# Patient Record
Sex: Female | Born: 1935 | Race: Black or African American | Hispanic: No | Smoking: Never smoker
Health system: Southern US, Community
[De-identification: ages and names within clinical notes are randomized; demographics above are authoritative.]

## PROBLEM LIST (undated history)

## (undated) DIAGNOSIS — I10 Essential (primary) hypertension: Secondary | ICD-10-CM

## (undated) DIAGNOSIS — I482 Chronic atrial fibrillation, unspecified: Secondary | ICD-10-CM

## (undated) DIAGNOSIS — I2510 Atherosclerotic heart disease of native coronary artery without angina pectoris: Secondary | ICD-10-CM

## (undated) HISTORY — PX: CORONARY ANGIOPLASTY WITH STENT PLACEMENT: SHX49

## (undated) HISTORY — DX: Atherosclerotic heart disease of native coronary artery without angina pectoris: I25.10

## (undated) HISTORY — PX: NASAL SINUS SURGERY: SHX719

## (undated) HISTORY — DX: Essential (primary) hypertension: I10

## (undated) HISTORY — PX: CHOLECYSTECTOMY: SHX55

## (undated) HISTORY — PX: ABDOMINAL HYSTERECTOMY: SHX81

## (undated) HISTORY — DX: Chronic atrial fibrillation, unspecified: I48.20

---

## 2005-10-17 ENCOUNTER — Emergency Department: Payer: Self-pay | Admitting: Emergency Medicine

## 2006-08-14 ENCOUNTER — Emergency Department: Payer: Self-pay | Admitting: Emergency Medicine

## 2006-08-14 ENCOUNTER — Other Ambulatory Visit: Payer: Self-pay

## 2007-02-27 ENCOUNTER — Emergency Department: Payer: Self-pay | Admitting: Emergency Medicine

## 2007-10-25 IMAGING — CR DG CHEST 2V
1 series · 2 of 2 positions shown · non-contrast
Comparison: none

REASON FOR EXAM: chest pain
COMMENTS:

PROCEDURE:     DXR - DXR CHEST PA (OR AP) AND LATERAL  - August 14, 2006 [DATE]
RESULT:     The lung fields are clear. The heart, mediastinal and osseous
structures show no significant abnormalities.

[Series 1: view not recorded · 0.17mm/px · 2 of 2 slices shown]
[im 1/2]
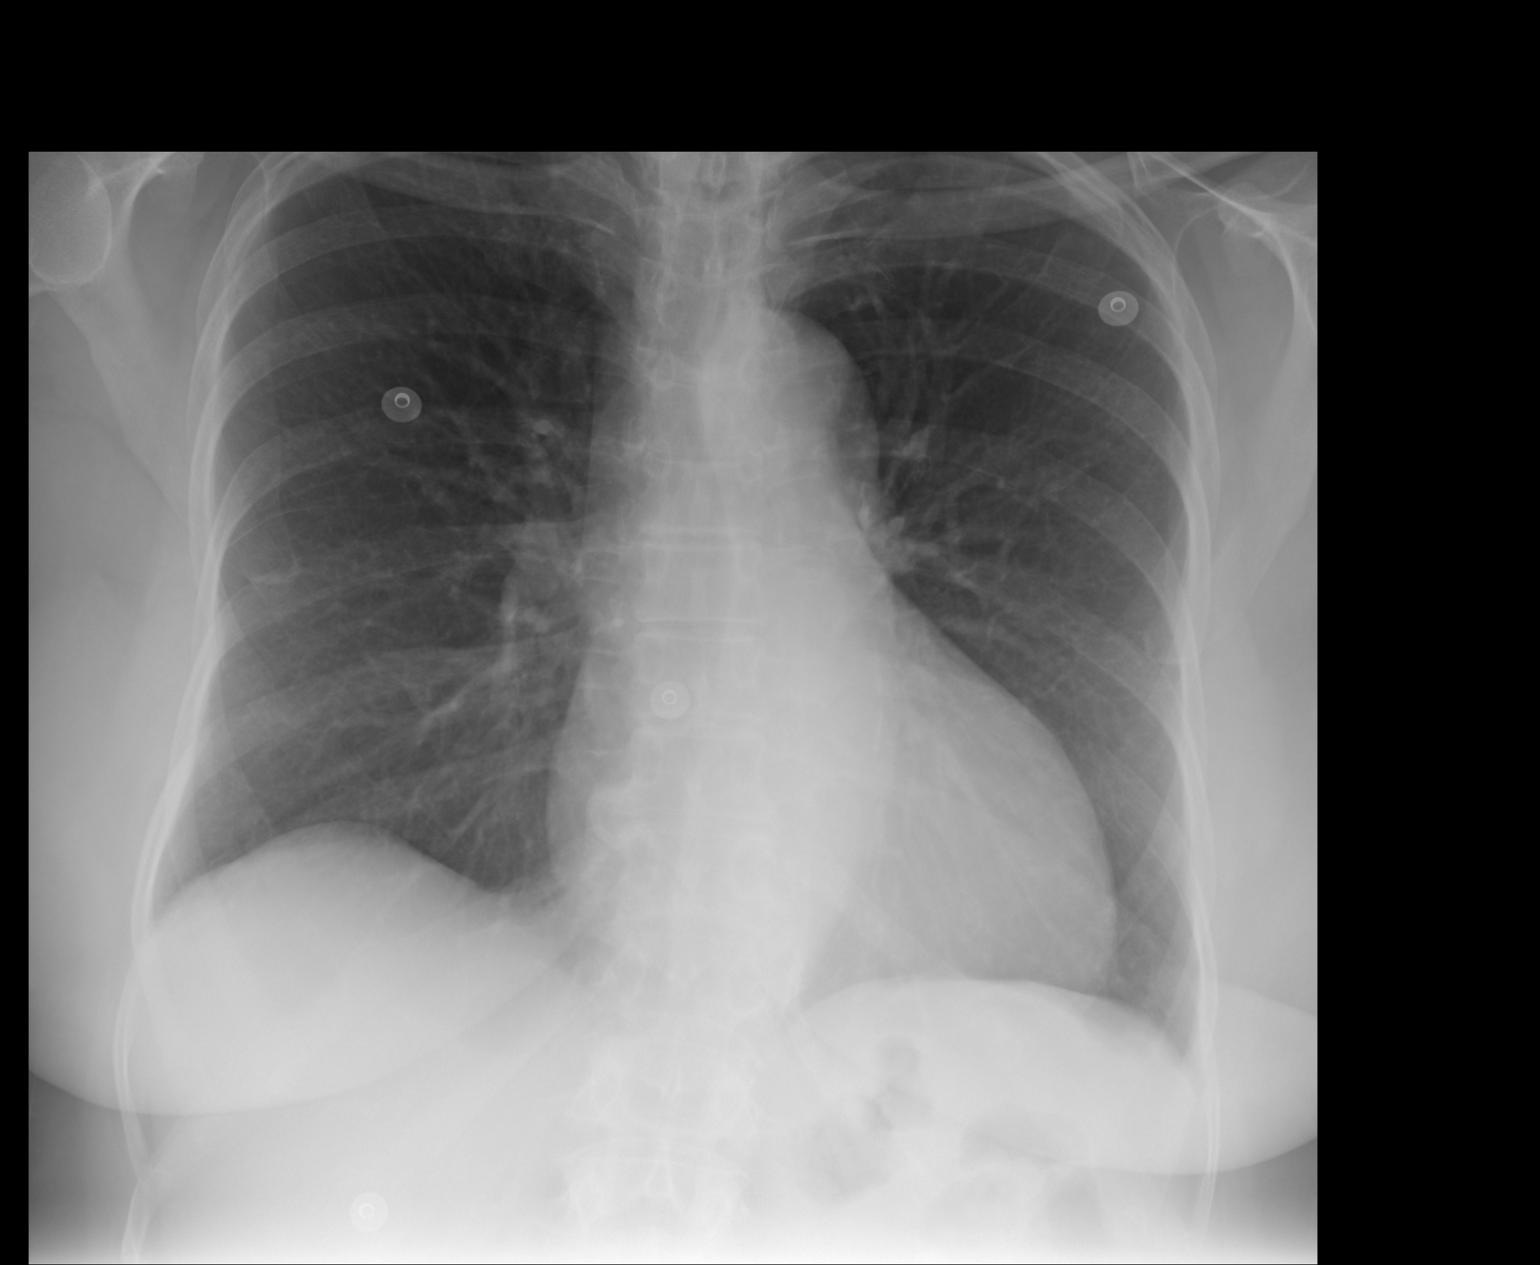
[im 2/2]
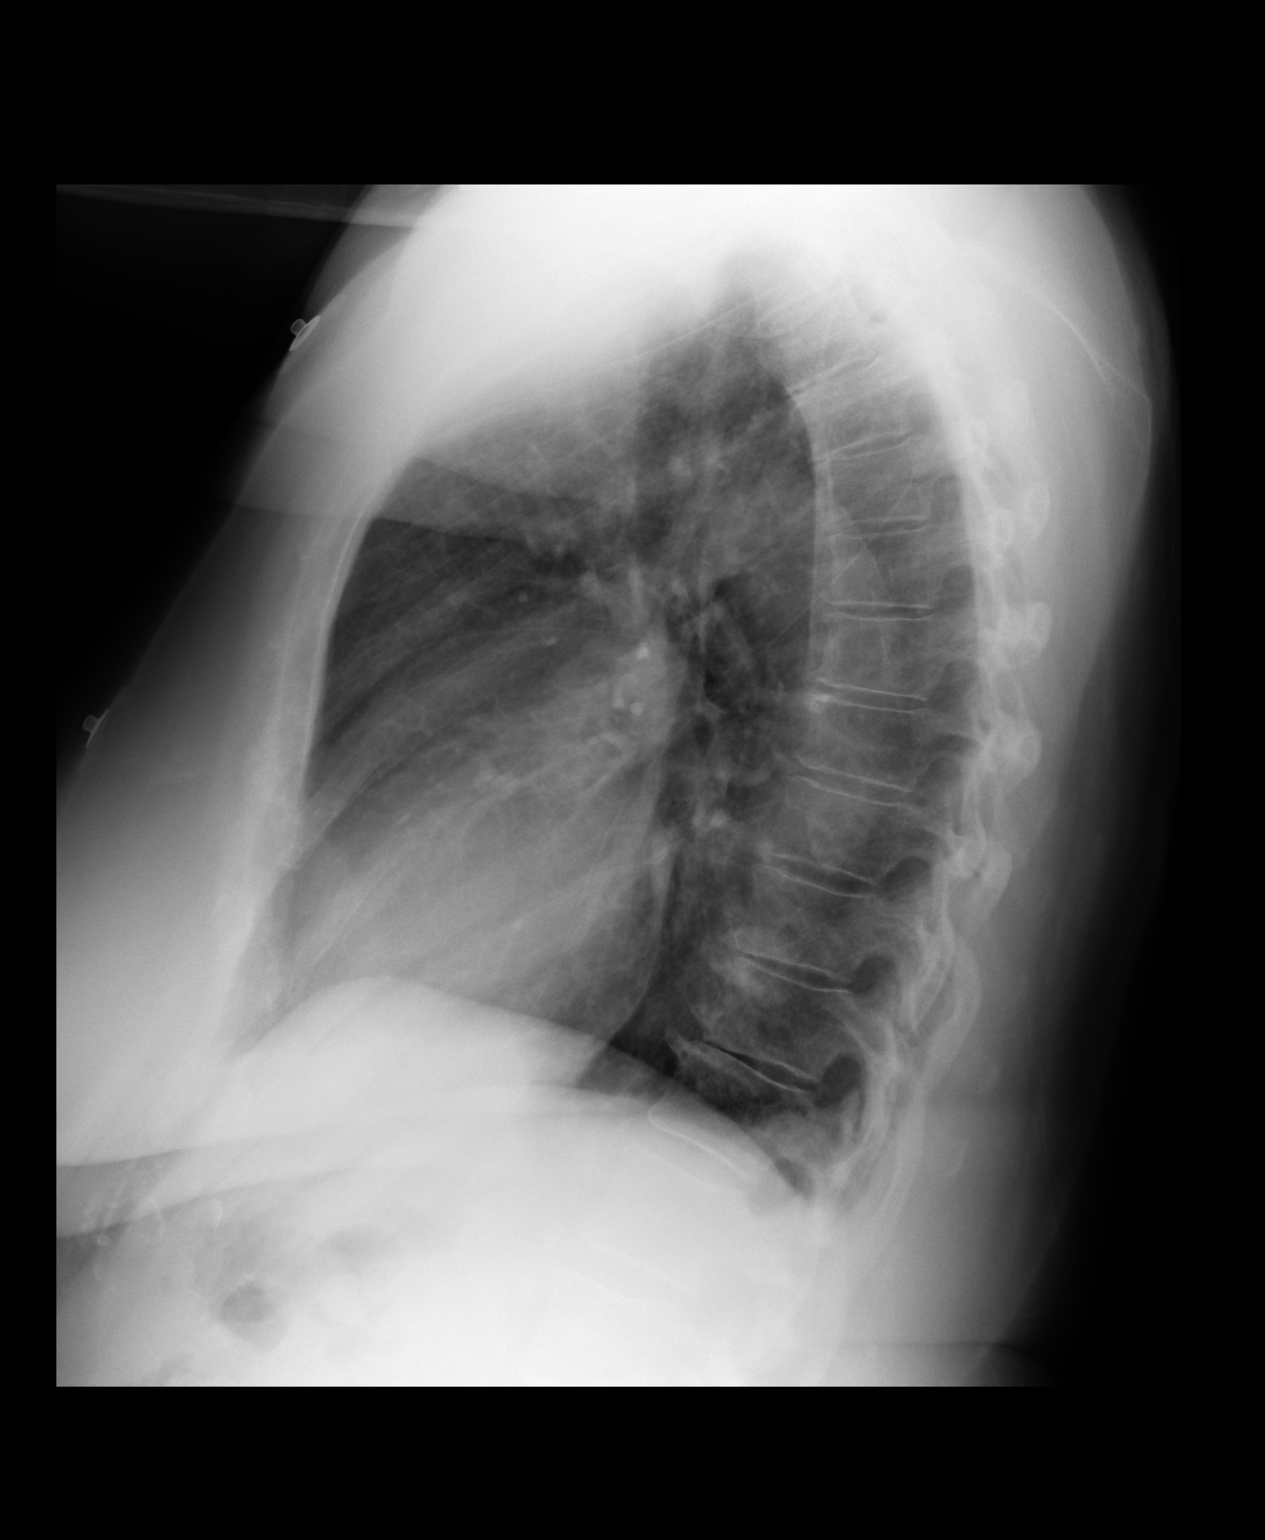

[2 of 2 positions shown; findings below may reference images not displayed]

IMPRESSION: 1)No acute changes are identified.

2)Although not mentioned above there is mild degenerative spurring at
multiple levels of the lower thoracic spine.
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
## 2008-05-09 IMAGING — CR PELVIS - 1-2 VIEW
1 series · 1 of 1 positions shown · non-contrast
Comparison: none

REASON FOR EXAM: pain/ hurts to walk no trauma
COMMENTS:

PROCEDURE:     DXR - DXR PELVIS AP ONLY  - February 27, 2007  [DATE]
RESULT:     An AP view of the bony pelvis shows no fracture or other acute
bony abnormality. The hips are visualized bilaterally and show no acute
changes. There are noted degenerative changes of the lower lumbar spine.

[view not recorded]
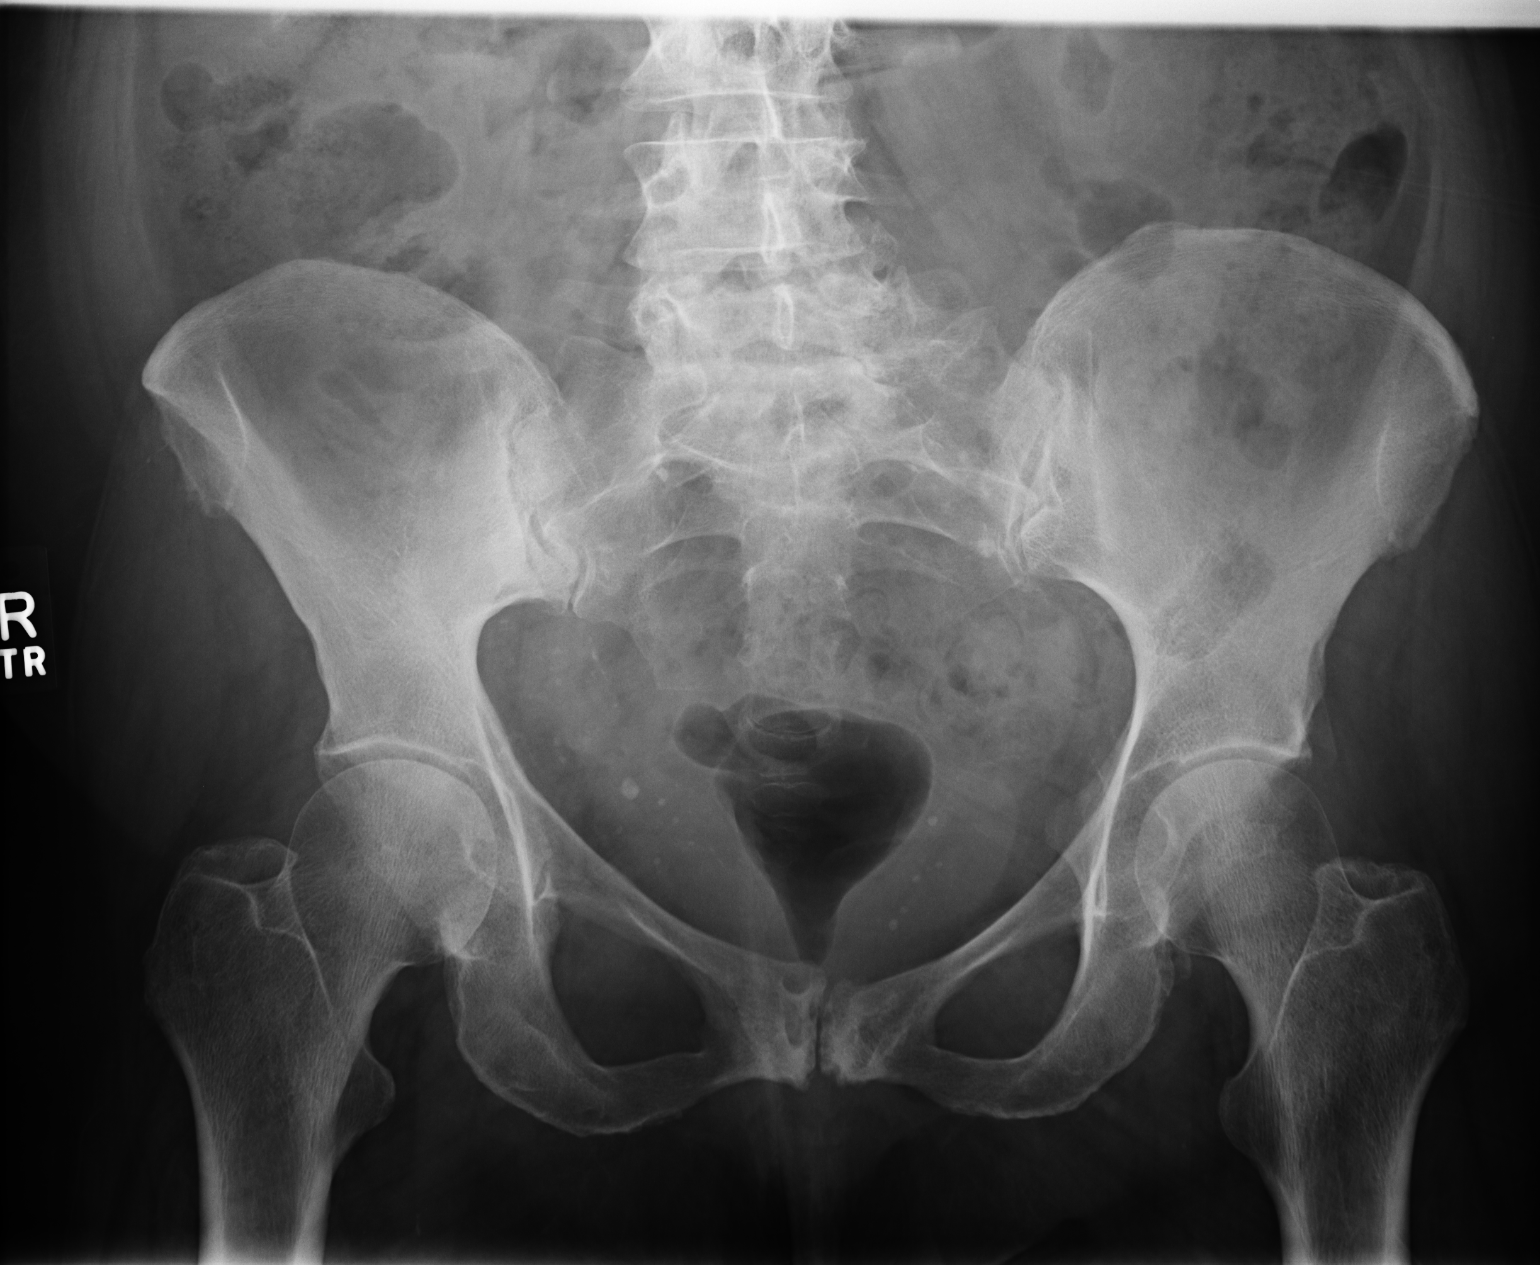

[1 of 1 positions shown; findings below may reference images not displayed]

IMPRESSION: 1.     No acute bony abnormalities of the pelvis are seen.
2.     There are degenerative changes of the lower lumbar spine.

## 2008-05-09 IMAGING — CR DG FEMUR 2V*L*
1 series · 5 of 5 positions shown · non-contrast
Comparison: none

REASON FOR EXAM: pain hurts to walk
COMMENTS:

[Series 1: view not recorded · 0.17mm/px · 5 of 5 slices shown]
[im 1/5]
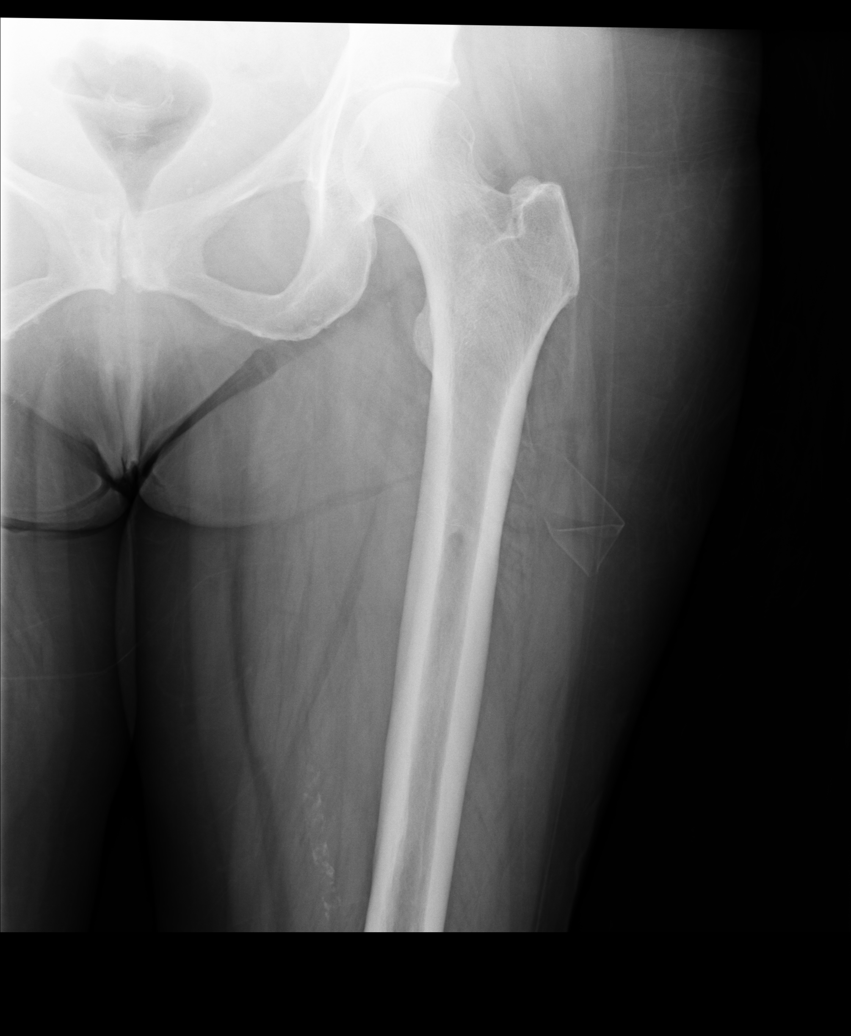
[im 2/5]
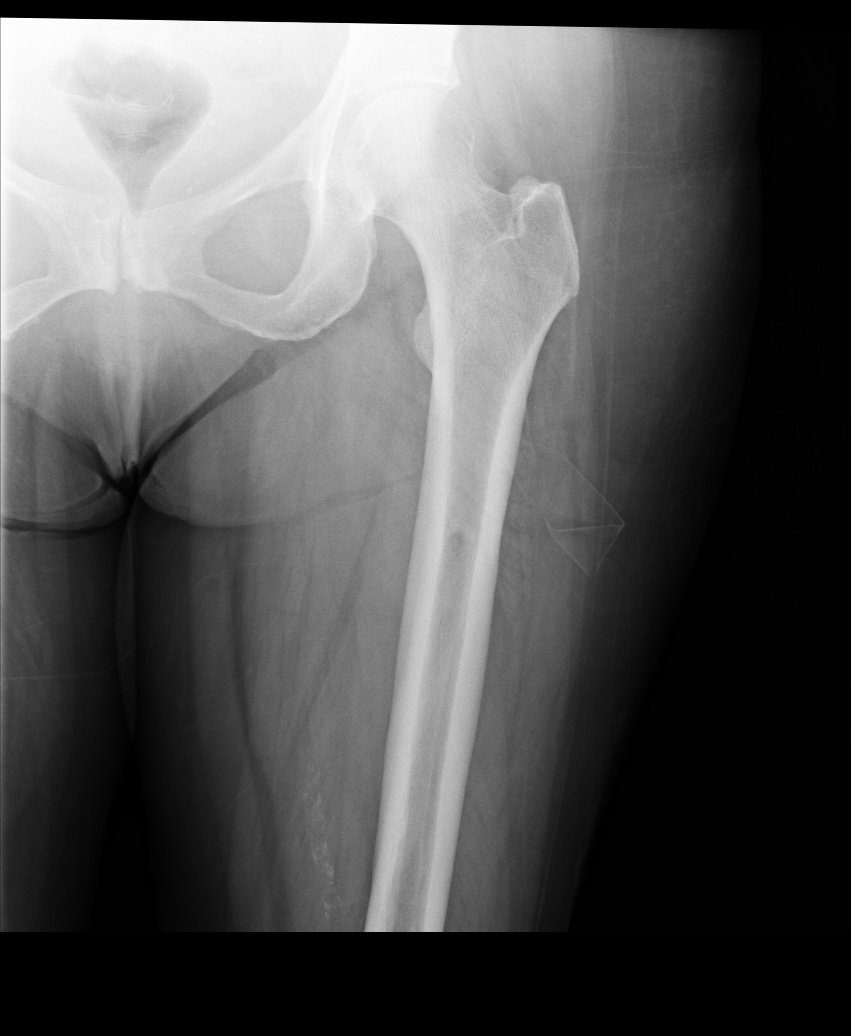
[im 3/5]
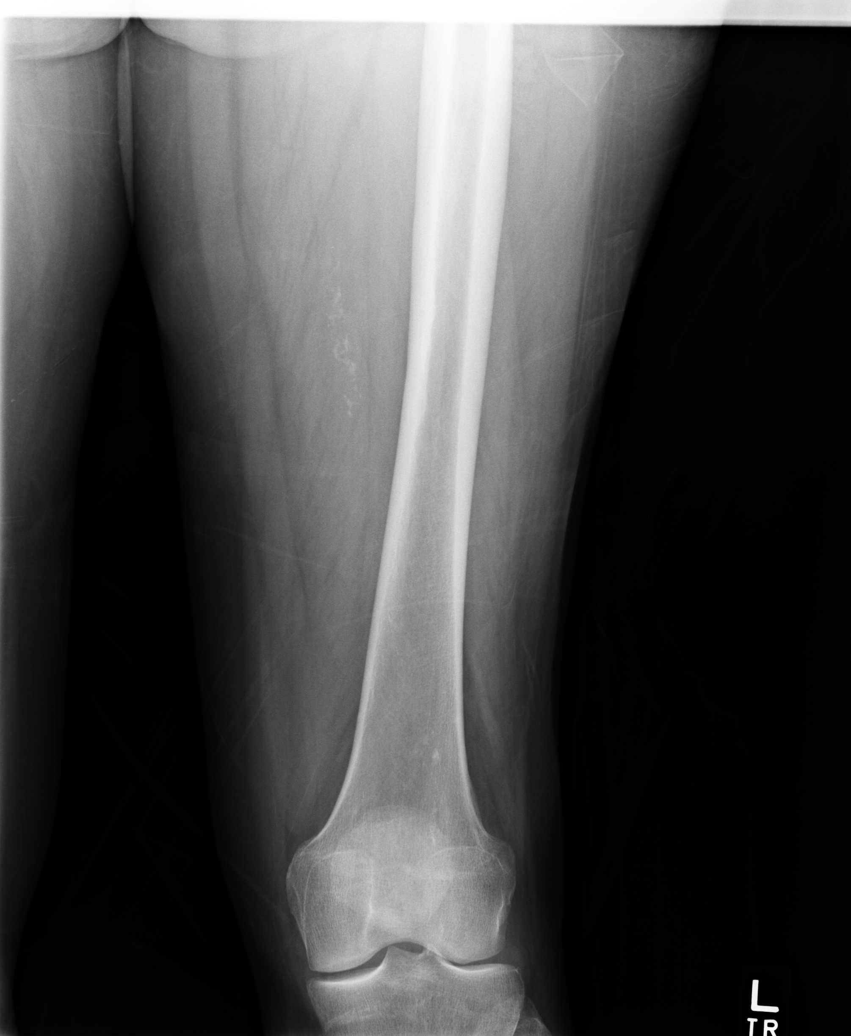
[im 4/5]
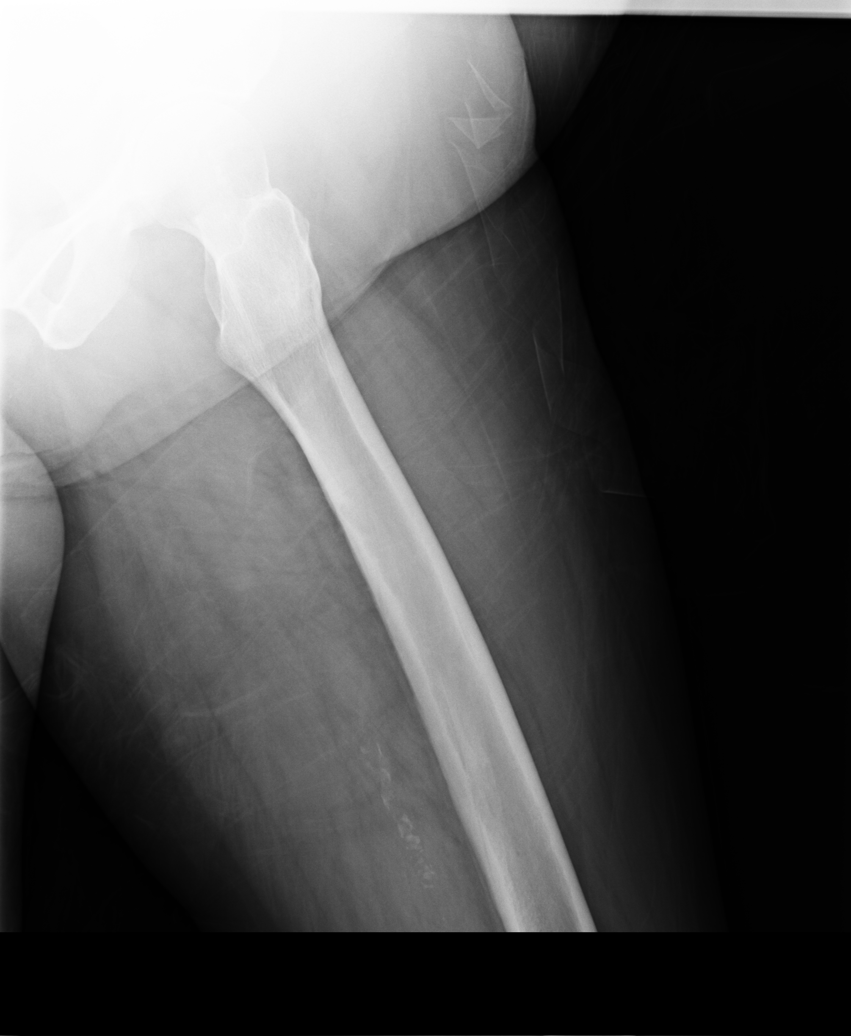
[im 5/5]
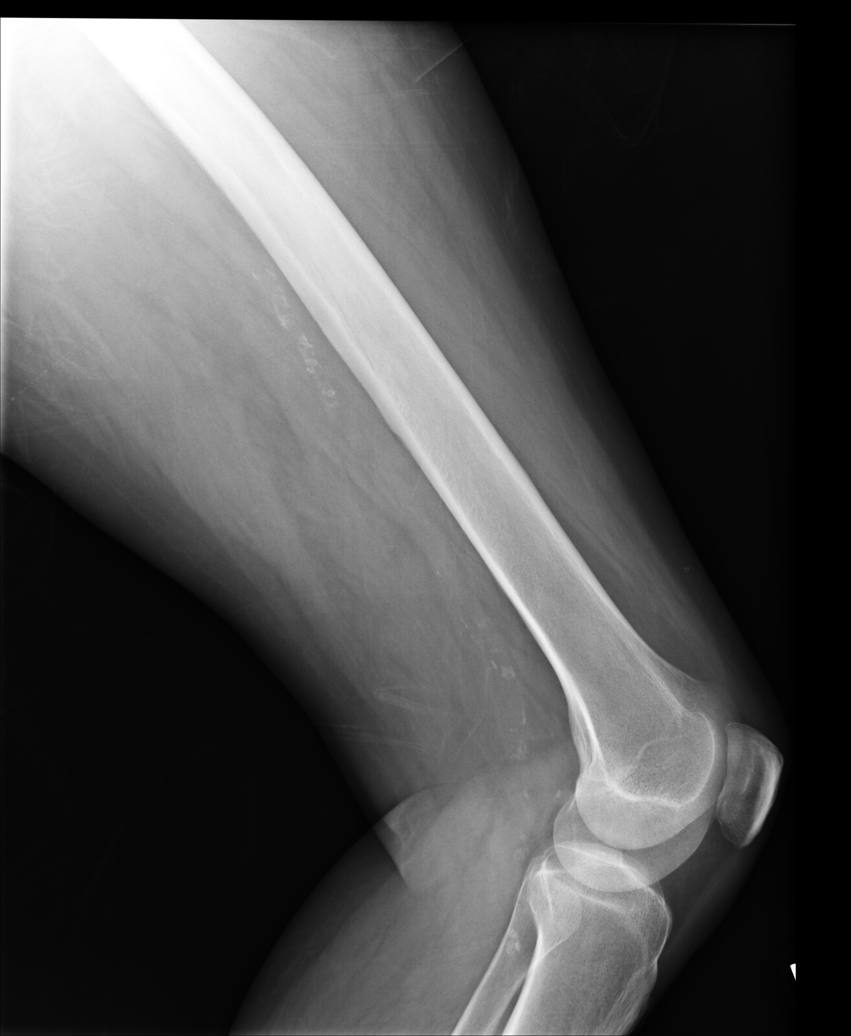

[5 of 5 positions shown; findings below may reference images not displayed]

PROCEDURE:     DXR - DXR FEMUR LEFT  - February 27, 2007  [DATE]

RESULT:     AP and lateral views show a 9 mm radiolucent focus in the
proximal LEFT tibial diaphysis. This is seen only in AP view and may be a
spurious finding. Followup examination is recommended. If this finding
persists, bone scan would be recommended for additional evaluation.
IMPRESSION: 1. No fracture or dislocation is seen.
2. There is a focal 9-mm lucent area in the proximal femoral diaphysis. As
noted above, this is seen in only one view. Followup examination and
possible bone scan are recommended.

## 2008-05-09 IMAGING — CR DG TIBIA/FIBULA 2V*L*
1 series · 2 of 2 positions shown · non-contrast
Comparison: none

REASON FOR EXAM: pain/hurts to walk no hx trauma
COMMENTS:

PROCEDURE:     DXR - DXR TIBIA AND FIBULA LT (LOWER L  - February 27, 2007  [DATE]
RESULT:     No fracture, dislocation or other acute bony abnormality is
identified.

[Series 1: view not recorded · 0.17mm/px · 2 of 2 slices shown]
[im 1/2]
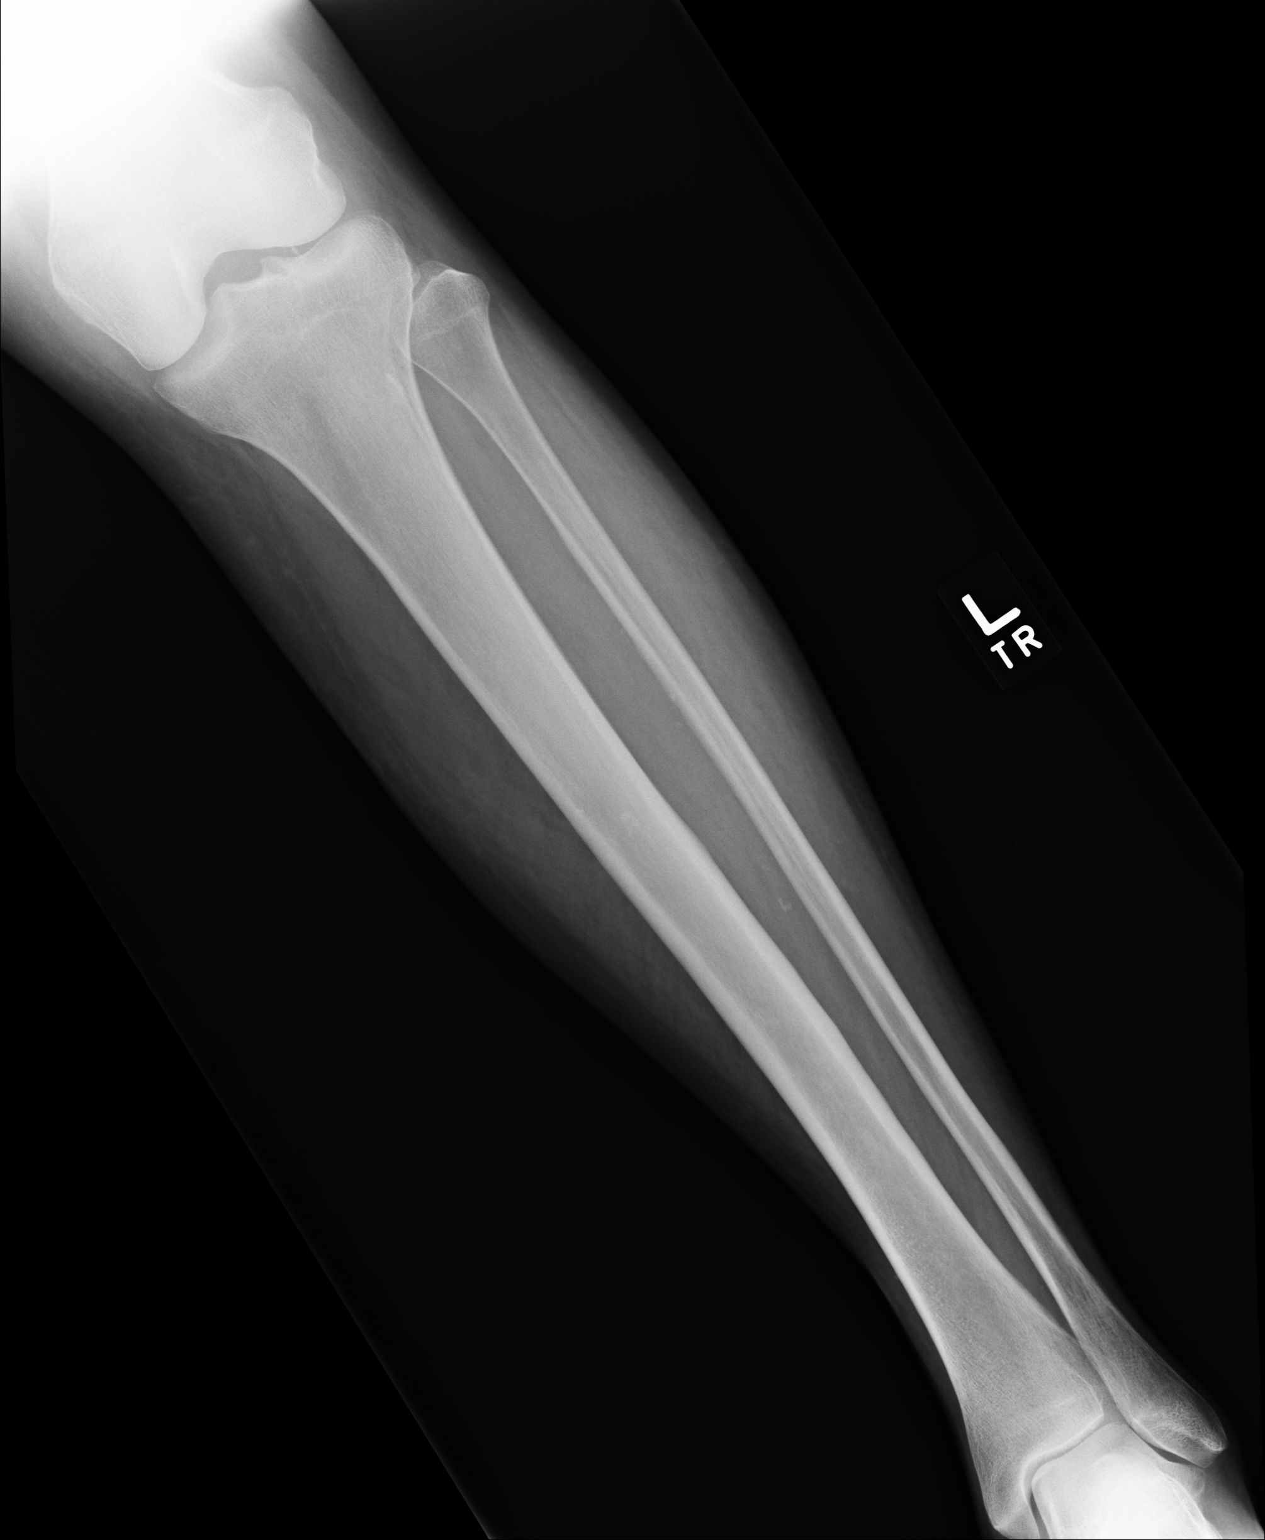
[im 2/2]
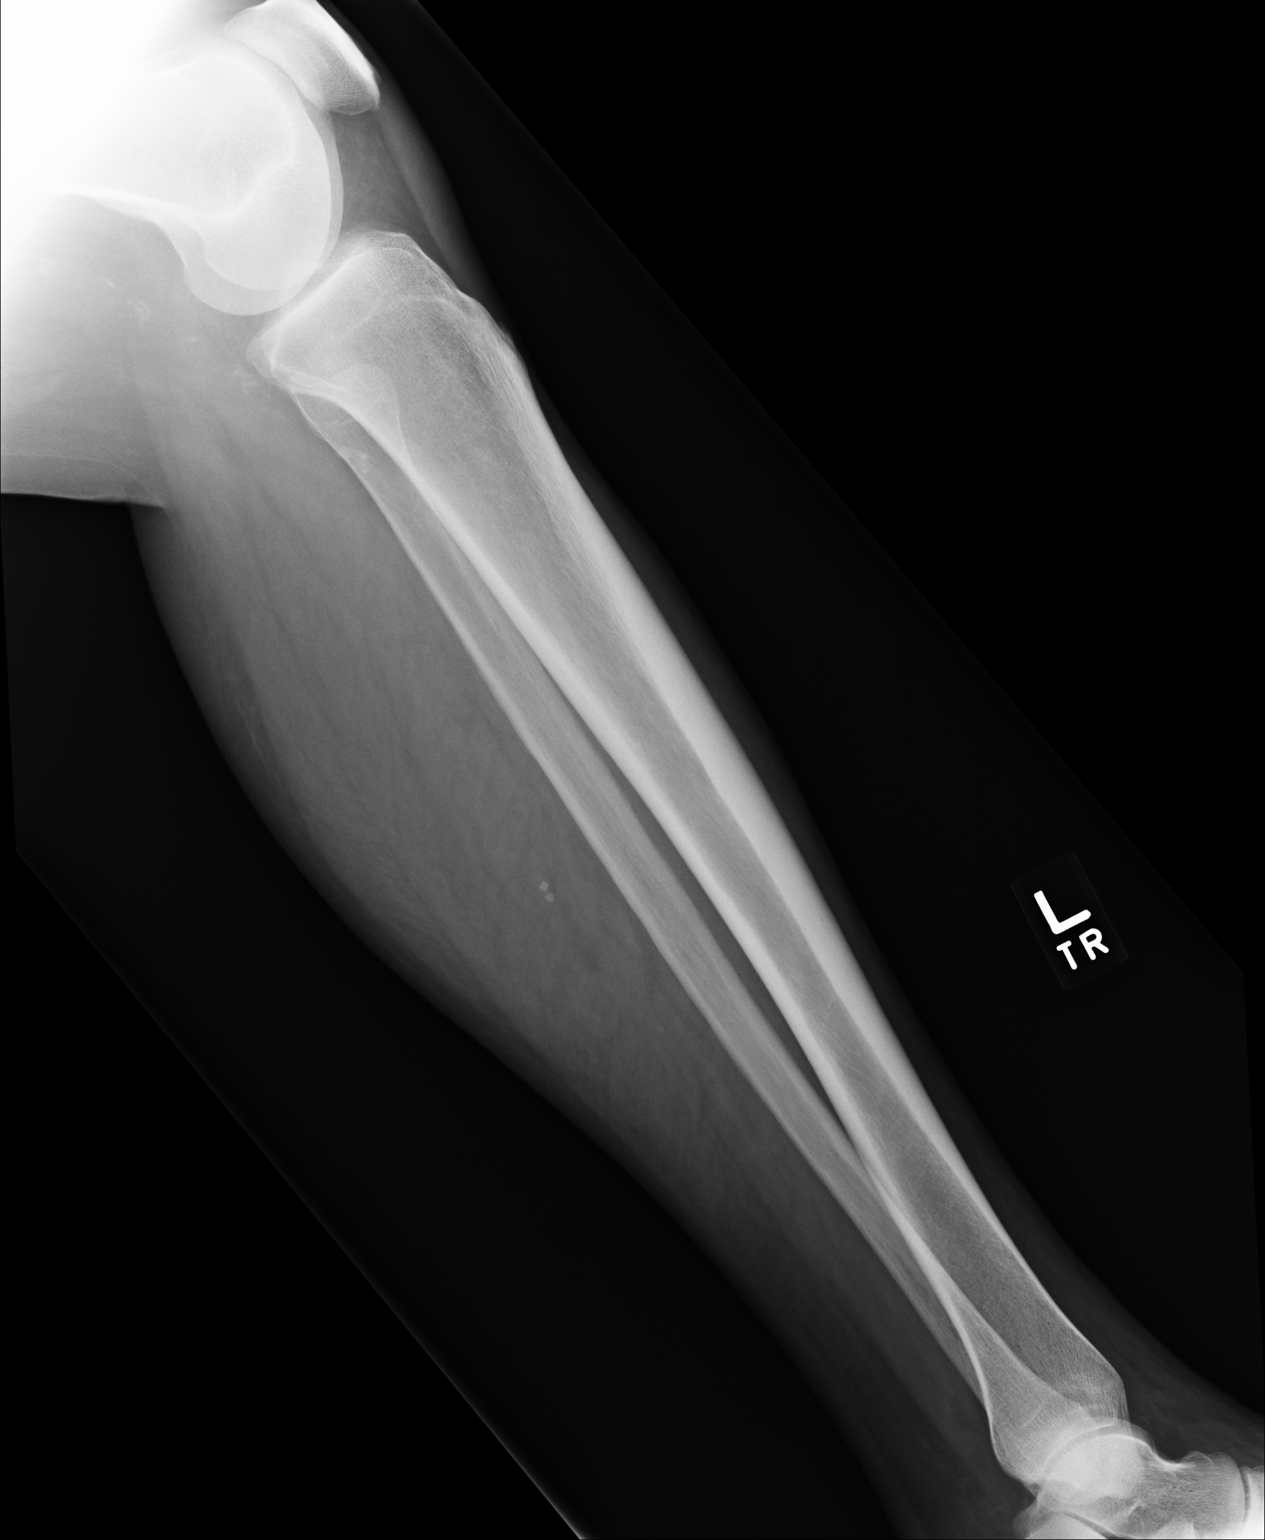

[2 of 2 positions shown; findings below may reference images not displayed]

IMPRESSION: No significant osseous abnormalities are noted.

## 2012-01-30 ENCOUNTER — Encounter: Payer: Self-pay | Admitting: Cardiology

## 2012-02-15 ENCOUNTER — Encounter: Payer: Self-pay | Admitting: Cardiology

## 2012-03-16 ENCOUNTER — Encounter: Payer: Self-pay | Admitting: Cardiology

## 2012-04-16 ENCOUNTER — Encounter: Payer: Self-pay | Admitting: Cardiology

## 2012-10-20 ENCOUNTER — Inpatient Hospital Stay: Payer: Self-pay | Admitting: Internal Medicine

## 2012-10-20 LAB — COMPREHENSIVE METABOLIC PANEL
Alkaline Phosphatase: 116 U/L (ref 50–136)
Anion Gap: 9 (ref 7–16)
BUN: 29 mg/dL — ABNORMAL HIGH (ref 7–18)
Calcium, Total: 9.5 mg/dL (ref 8.5–10.1)
Creatinine: 1.09 mg/dL (ref 0.60–1.30)
EGFR (African American): 56 — ABNORMAL LOW
Glucose: 88 mg/dL (ref 65–99)
Potassium: 4 mmol/L (ref 3.5–5.1)
SGPT (ALT): 29 U/L (ref 12–78)
Sodium: 140 mmol/L (ref 136–145)

## 2012-10-20 LAB — CBC
HCT: 37.2 % (ref 35.0–47.0)
MCH: 25.2 pg — ABNORMAL LOW (ref 26.0–34.0)
MCHC: 32.3 g/dL (ref 32.0–36.0)
MCV: 78 fL — ABNORMAL LOW (ref 80–100)
Platelet: 202 10*3/uL (ref 150–440)
RDW: 16.4 % — ABNORMAL HIGH (ref 11.5–14.5)
WBC: 4 10*3/uL (ref 3.6–11.0)

## 2012-10-20 LAB — TROPONIN I
Troponin-I: 0.11 ng/mL — ABNORMAL HIGH
Troponin-I: 0.11 ng/mL — ABNORMAL HIGH

## 2012-10-20 LAB — TSH: Thyroid Stimulating Horm: 2.85 u[IU]/mL

## 2012-10-20 LAB — MAGNESIUM: Magnesium: 1.8 mg/dL

## 2012-10-20 LAB — CK TOTAL AND CKMB (NOT AT ARMC)
CK, Total: 146 U/L (ref 21–215)
CK-MB: 1.2 ng/mL (ref 0.5–3.6)

## 2012-10-20 LAB — APTT: Activated PTT: 30.3 secs (ref 23.6–35.9)

## 2012-10-20 LAB — PROTIME-INR: Prothrombin Time: 13.7 secs (ref 11.5–14.7)

## 2012-10-21 LAB — LIPID PANEL
Cholesterol: 167 mg/dL (ref 0–200)
HDL Cholesterol: 40 mg/dL (ref 40–60)
Triglycerides: 62 mg/dL (ref 0–200)
VLDL Cholesterol, Calc: 12 mg/dL (ref 5–40)

## 2012-10-21 LAB — APTT
Activated PTT: >160 secs (ref 23.6–35.9)
Activated PTT: 76.9 secs — ABNORMAL HIGH (ref 23.6–35.9)

## 2012-10-21 LAB — BASIC METABOLIC PANEL
Calcium, Total: 8.8 mg/dL (ref 8.5–10.1)
Chloride: 106 mmol/L (ref 98–107)
Co2: 26 mmol/L (ref 21–32)
Creatinine: 1.04 mg/dL (ref 0.60–1.30)
EGFR (African American): 59 — ABNORMAL LOW
EGFR (Non-African Amer.): 51 — ABNORMAL LOW
Osmolality: 282 (ref 275–301)
Potassium: 3.1 mmol/L — ABNORMAL LOW (ref 3.5–5.1)

## 2012-10-21 LAB — CBC WITH DIFFERENTIAL/PLATELET
Basophil #: 0 10*3/uL (ref 0.0–0.1)
Basophil %: 0.8 %
Eosinophil %: 6.3 %
HCT: 34.2 % — ABNORMAL LOW (ref 35.0–47.0)
HGB: 10.8 g/dL — ABNORMAL LOW (ref 12.0–16.0)
MCH: 24.7 pg — ABNORMAL LOW (ref 26.0–34.0)
Monocyte #: 0.5 x10 3/mm (ref 0.2–0.9)
Monocyte %: 11.5 %
Neutrophil #: 2 10*3/uL (ref 1.4–6.5)
WBC: 4.2 10*3/uL (ref 3.6–11.0)

## 2012-10-21 LAB — TROPONIN I: Troponin-I: 0.1 ng/mL — ABNORMAL HIGH

## 2013-12-31 IMAGING — CR DG CHEST 1V PORT
1 series · 1 of 1 positions shown · non-contrast
Comparison: none

REASON FOR EXAM: Chest Pain
COMMENTS:

[ap]
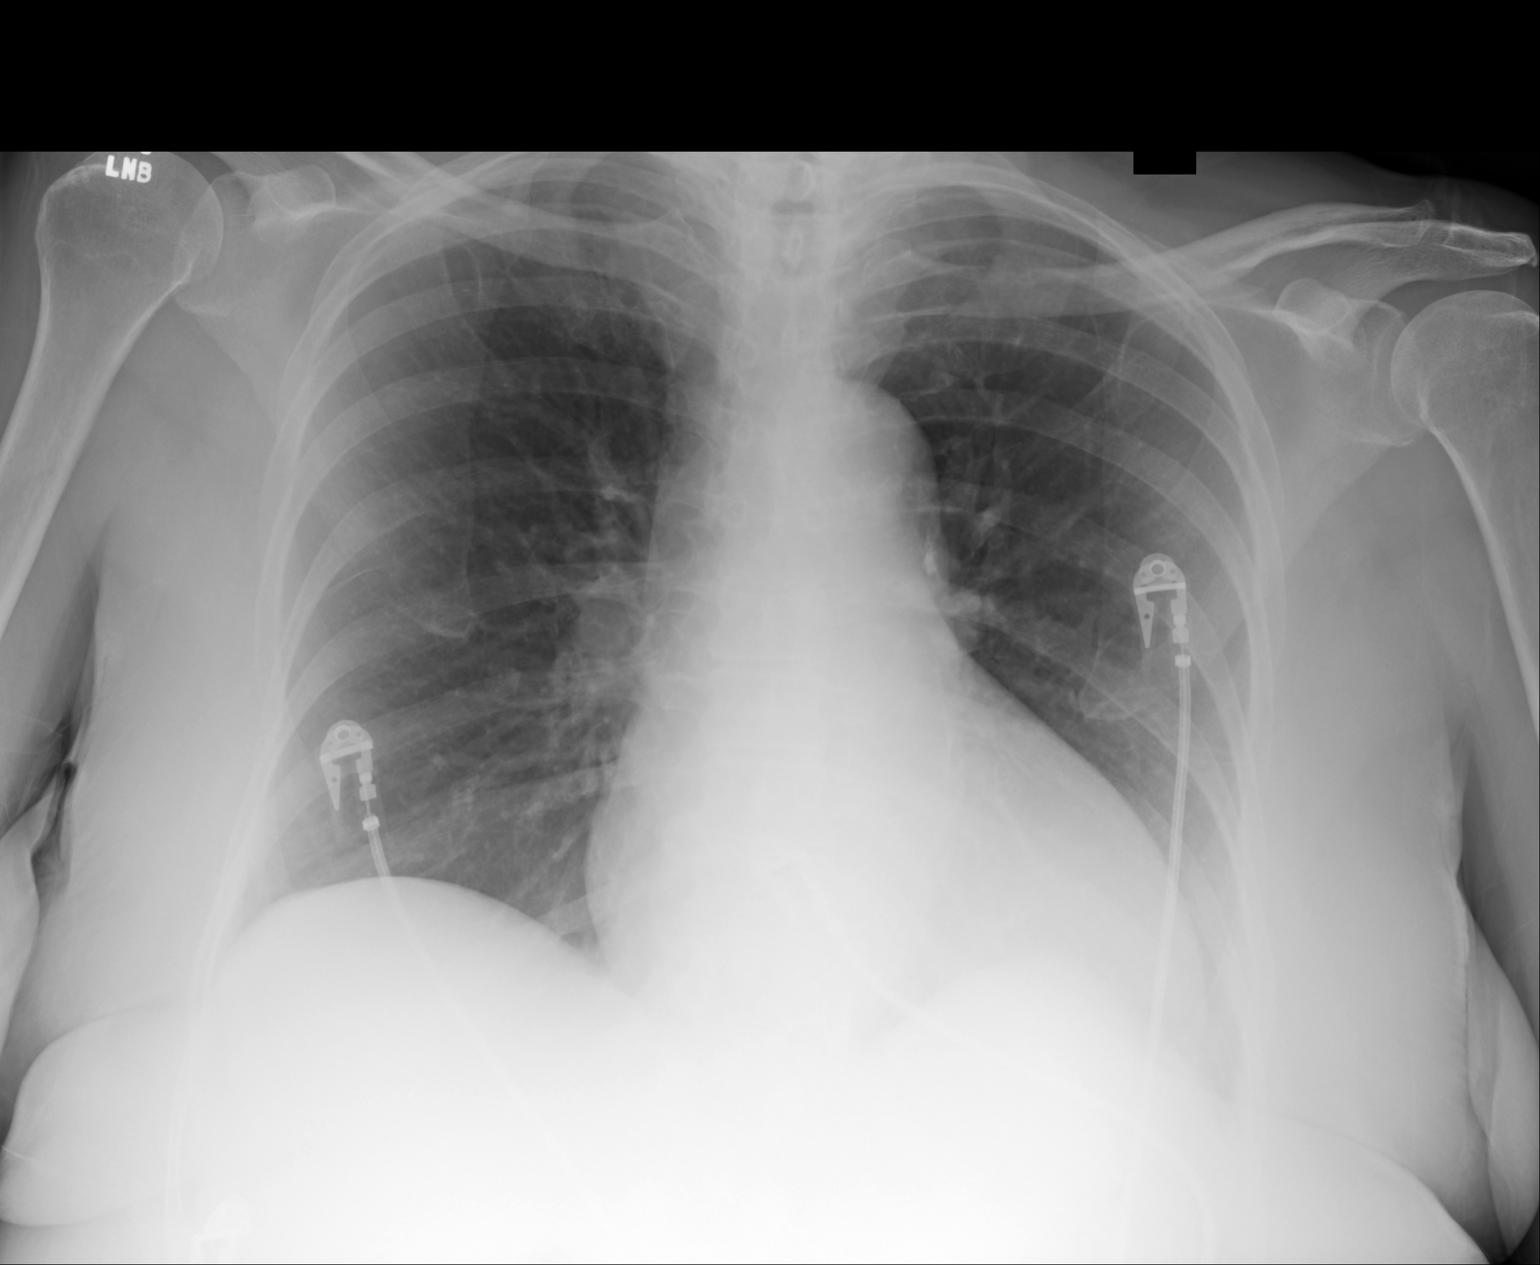

[1 of 1 positions shown; findings below may reference images not displayed]

PROCEDURE:     DXR - DXR PORTABLE CHEST SINGLE VIEW  - October 20, 2012  [DATE]

RESULT:     The lungs are adequately inflated. The cardiac silhouette is top
normal in size. There is tortuosity of the descending thoracic aorta. The
pulmonary vascularity is not engorged. No pleural effusion is demonstrated.
IMPRESSION: There is no evidence of acute cardiopulmonary abnormality.

[REDACTED]

## 2014-02-15 ENCOUNTER — Inpatient Hospital Stay: Payer: Self-pay | Admitting: Specialist

## 2014-02-15 LAB — CBC WITH DIFFERENTIAL/PLATELET
BASOS ABS: 0.1 10*3/uL (ref 0.0–0.1)
Basophil %: 1.4 %
EOS ABS: 0.1 10*3/uL (ref 0.0–0.7)
EOS PCT: 3 %
HCT: 38 % (ref 35.0–47.0)
HGB: 12.3 g/dL (ref 12.0–16.0)
LYMPHS ABS: 1.1 10*3/uL (ref 1.0–3.6)
Lymphocyte %: 25.3 %
MCH: 25.4 pg — ABNORMAL LOW (ref 26.0–34.0)
MCHC: 32.2 g/dL (ref 32.0–36.0)
MCV: 79 fL — AB (ref 80–100)
MONOS PCT: 8.3 %
Monocyte #: 0.4 x10 3/mm (ref 0.2–0.9)
NEUTROS ABS: 2.6 10*3/uL (ref 1.4–6.5)
Neutrophil %: 62 %
Platelet: 180 10*3/uL (ref 150–440)
RBC: 4.82 10*6/uL (ref 3.80–5.20)
RDW: 15.7 % — ABNORMAL HIGH (ref 11.5–14.5)
WBC: 4.3 10*3/uL (ref 3.6–11.0)

## 2014-02-15 LAB — COMPREHENSIVE METABOLIC PANEL
ANION GAP: 7 (ref 7–16)
Albumin: 3.8 g/dL (ref 3.4–5.0)
Alkaline Phosphatase: 92 U/L
BILIRUBIN TOTAL: 0.6 mg/dL (ref 0.2–1.0)
BUN: 15 mg/dL (ref 7–18)
CREATININE: 0.98 mg/dL (ref 0.60–1.30)
Calcium, Total: 9.7 mg/dL (ref 8.5–10.1)
Chloride: 104 mmol/L (ref 98–107)
Co2: 28 mmol/L (ref 21–32)
EGFR (African American): >60
GFR CALC NON AF AMER: 54 — AB
Glucose: 90 mg/dL (ref 65–99)
Osmolality: 278 (ref 275–301)
Potassium: 3.4 mmol/L — ABNORMAL LOW (ref 3.5–5.1)
SGOT(AST): 17 U/L (ref 15–37)
SGPT (ALT): 16 U/L (ref 12–78)
Sodium: 139 mmol/L (ref 136–145)
TOTAL PROTEIN: 8.3 g/dL — AB (ref 6.4–8.2)

## 2014-02-15 LAB — URINALYSIS, COMPLETE
BILIRUBIN, UR: NEGATIVE
Glucose,UR: NEGATIVE mg/dL (ref 0–75)
Leukocyte Esterase: NEGATIVE
Nitrite: NEGATIVE
PH: 5 (ref 4.5–8.0)
Protein: >=500
RBC,UR: <1 /HPF (ref 0–5)
Specific Gravity: 1.015 (ref 1.003–1.030)
Squamous Epithelial: 7
WBC UR: 3 /HPF (ref 0–5)

## 2014-02-15 LAB — TROPONIN I
Troponin-I: 0.09 ng/mL — ABNORMAL HIGH
Troponin-I: 0.11 ng/mL — ABNORMAL HIGH

## 2014-02-15 LAB — PROTIME-INR
INR: 1
Prothrombin Time: 12.8 secs (ref 11.5–14.7)

## 2014-02-15 LAB — APTT: Activated PTT: 31.6 secs (ref 23.6–35.9)

## 2014-02-16 LAB — CBC WITH DIFFERENTIAL/PLATELET
BASOS PCT: 0.7 %
Basophil #: 0 10*3/uL (ref 0.0–0.1)
Eosinophil #: 0 10*3/uL (ref 0.0–0.7)
Eosinophil %: 0.8 %
HCT: 38 % (ref 35.0–47.0)
HGB: 12.3 g/dL (ref 12.0–16.0)
LYMPHS ABS: 0.9 10*3/uL — AB (ref 1.0–3.6)
Lymphocyte %: 18 %
MCH: 25.6 pg — AB (ref 26.0–34.0)
MCHC: 32.4 g/dL (ref 32.0–36.0)
MCV: 79 fL — ABNORMAL LOW (ref 80–100)
Monocyte #: 0.2 x10 3/mm (ref 0.2–0.9)
Monocyte %: 4.3 %
NEUTROS PCT: 76.2 %
Neutrophil #: 3.8 10*3/uL (ref 1.4–6.5)
Platelet: 193 10*3/uL (ref 150–440)
RBC: 4.8 10*6/uL (ref 3.80–5.20)
RDW: 15.8 % — AB (ref 11.5–14.5)
WBC: 5 10*3/uL (ref 3.6–11.0)

## 2014-02-16 LAB — BASIC METABOLIC PANEL
Anion Gap: 8 (ref 7–16)
BUN: 16 mg/dL (ref 7–18)
CHLORIDE: 106 mmol/L (ref 98–107)
CO2: 25 mmol/L (ref 21–32)
CREATININE: 0.98 mg/dL (ref 0.60–1.30)
Calcium, Total: 9.3 mg/dL (ref 8.5–10.1)
GFR CALC NON AF AMER: 54 — AB
GLUCOSE: 98 mg/dL (ref 65–99)
Osmolality: 279 (ref 275–301)
Potassium: 3.4 mmol/L — ABNORMAL LOW (ref 3.5–5.1)
SODIUM: 139 mmol/L (ref 136–145)

## 2014-02-16 LAB — TROPONIN I: Troponin-I: 0.11 ng/mL — ABNORMAL HIGH

## 2014-03-24 ENCOUNTER — Encounter: Payer: Self-pay | Admitting: Podiatry

## 2014-04-04 ENCOUNTER — Ambulatory Visit: Payer: Self-pay | Admitting: Podiatry

## 2014-04-18 ENCOUNTER — Encounter: Payer: Self-pay | Admitting: Podiatry

## 2014-04-18 ENCOUNTER — Ambulatory Visit (INDEPENDENT_AMBULATORY_CARE_PROVIDER_SITE_OTHER): Payer: Medicare Other

## 2014-04-18 ENCOUNTER — Ambulatory Visit (INDEPENDENT_AMBULATORY_CARE_PROVIDER_SITE_OTHER): Payer: Medicare Other | Admitting: Podiatry

## 2014-04-18 VITALS — BP 182/86 | HR 49 | Resp 16 | Ht 63.0 in | Wt 142.0 lb

## 2014-04-18 DIAGNOSIS — B351 Tinea unguium: Secondary | ICD-10-CM

## 2014-04-18 DIAGNOSIS — M21619 Bunion of unspecified foot: Secondary | ICD-10-CM

## 2014-04-18 DIAGNOSIS — Q828 Other specified congenital malformations of skin: Secondary | ICD-10-CM

## 2014-04-18 DIAGNOSIS — M79676 Pain in unspecified toe(s): Secondary | ICD-10-CM

## 2014-04-18 DIAGNOSIS — M21612 Bunion of left foot: Secondary | ICD-10-CM

## 2014-04-18 DIAGNOSIS — M79609 Pain in unspecified limb: Secondary | ICD-10-CM

## 2014-04-18 NOTE — Progress Notes (Signed)
   Subjective:    Patient ID: Isabel Molina, female    DOB: 02/06/1931, 80 y.o.   MRN: [id number]  HPI Comments: i have a fungus on my big toenail rt foot and i have a bunion on my left foot. i have had foot problems for 3 - 4 months. i had the nail removed 10 + yrs ago. The nail does not hurt. The bunion hurts. My feet are getting worse. i use alcohol on my feet.  Foot Pain      Review of Systems  Skin:       Change in nails  Neurological: Positive for dizziness and light-headedness.  All other systems reviewed and are negative.      Objective:   Physical Exam: I have reviewed her past medical history medications allergies surgeries social history and review of systems. Pulses are strongly palpable bilateral. Neurologic sensorium is intact per Semmes-Weinstein monofilament. Deep tendon reflexes are intact bilateral muscle strength is 5 over 5 dorsiflexors plantar flexors inverters everters all intrinsic musculature is intact. Orthopedic evaluation demonstrates all joints distal to the ankle a full range of motion without crepitation. With exception of severe hallux abductovalgus deformity left greater than right. Cutaneous evaluation demonstrates supple well hydrated cutis with a total dystrophic mycotic painful hallux nail right foot and a porokeratotic lesion plantar aspect first metatarsal phalangeal joint left.        Assessment & Plan:  Assessment: Hallux valgus bilateral. Pain in limb secondary to onychomycosis. Porokeratosis plantar aspect left foot.  Plan: Discussed etiology pathology conservative or surgical therapies. Debrided reactive hyperkeratosis his 1 through 5 bilateral covered service secondary to pain. Debridement reactive hyperkeratotic lesion plantar aspect hallux left.

## 2014-07-18 ENCOUNTER — Ambulatory Visit (INDEPENDENT_AMBULATORY_CARE_PROVIDER_SITE_OTHER): Payer: Medicare Other | Admitting: Podiatry

## 2014-07-18 DIAGNOSIS — B351 Tinea unguium: Secondary | ICD-10-CM

## 2014-07-18 DIAGNOSIS — M79676 Pain in unspecified toe(s): Secondary | ICD-10-CM

## 2014-07-18 DIAGNOSIS — Q828 Other specified congenital malformations of skin: Secondary | ICD-10-CM

## 2014-07-18 NOTE — Progress Notes (Signed)
She presents today with a chief complaint of painful elongated nails.  Objective: Vital signs are stable she is alert and oriented 3. Nails are thick yellow dystrophic with mycotic and painful palpation.  Assessment: Pain in limb secondary to onychomycosis 1 through 5.  Plan: Debrided nails 1 through 5 bilateral covered service secondary to pain.

## 2014-10-17 ENCOUNTER — Ambulatory Visit: Payer: Self-pay

## 2014-10-17 ENCOUNTER — Ambulatory Visit: Payer: Medicare Other | Admitting: Podiatry

## 2014-10-24 ENCOUNTER — Ambulatory Visit: Payer: Self-pay

## 2014-10-31 ENCOUNTER — Ambulatory Visit: Payer: Self-pay

## 2014-11-15 ENCOUNTER — Inpatient Hospital Stay: Payer: Self-pay | Admitting: Internal Medicine

## 2015-01-06 NOTE — H&P (Signed)
PATIENT NAME:  Isabel Molina, Isabel Molina MR#:  [id number] DATE OF BIRTH:  November 08, 1930  DATE OF ADMISSION:  10/20/2012  PR[facility]IC[facility], Dr. Vedia CofferBlack.   CHIEF COMPLAINT: Chest pain.   HISTORY OF PRESENT ILLNESS: This is a 81 year old female with history of coronary artery disease, last cath back last February. As per patient, had a stent. She presents with pains in the chest that happened all day yesterday and felt like a heart attack. She had cramping in the neck, pain radiating down bilateral arms, could not bend over. There was a dull ache in the chest, very severe, 10 out of 10 in intensity. Nothing made it better or worse. No relation to food or exercise. Pain free for the last 2 hours. It got better after aspirin. It was associated with sweating and nausea, but no vomiting and no shortness of breath. She did not take her blood pressure medications yet today. In the ER, her blood pressure was found to be very elevated at 180/100 and found to have a borderline troponin at 0.11. Hospitalist services were contacted for further evaluation.   PAST MEDICAL HISTORY: Depression, hypertension, hyperlipidemia and coronary artery disease.   PAST SURGICAL HISTORY: Hysterectomy and bladder.   ALLERGIES: No known drug allergies.   MEDICATIONS: As per prescription writer include amlodipine 5 mg daily, aspirin 81 mg daily, atorvastatin 80 mg at bedtime, hydrochlorothiazide 25 mg daily, lisinopril 20 mg daily, metoprolol ER 50 mg daily, nitroglycerin 0.4 mg as needed for chest pain.   SOCIAL HISTORY: No smoking. No alcohol. No drug use. 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 Lives with a friend.   FAMILY HISTORY: Mother died at [id number] of hypertensive complications and also had diabetes and lost her leg. Father died of colon cancer.   REVIEW OF SYSTEMS:  CONSTITUTIONAL: Positive for sweating. Positive for chills. No fever. Positive for weight loss by dieting. Positive for fatigue.  EYES:  She needs higher prescription on reading glasses.  EARS, NOSE, MOUTH, AND THROAT: Positive for runny nose. Occasional dysphagia to liquids.  CARDIOVASCULAR: Positive for chest pain. No palpitations.  RESPIRATORY: Positive for coughing. No shortness of breath. No sputum. No hemoptysis.  GASTROINTESTINAL: Positive for nausea. No vomiting. No abdominal pain. No diarrhea. Positive for constipation. No bright red blood per rectum. No melena.  GENITOURINARY: No burning on urination or hematuria.  MUSCULOSKELETAL: Positive for left leg hurting. She had a fall the other day on the porch.  INTEGUMENT: No rashes or eruptions.  NEUROLOGIC: No fainting or blackouts.  PSYCHIATRIC: Positive for depression.  ENDOCRINE: No thyroid problems.  HEMATOLOGIC/LYMPHATIC: No anemia. No easy bruising or bleeding.   PHYSICAL EXAMINATION:  VITAL SIGNS: Blood pressure 180/100, temperature 97.8, pulse 82, respirations 20, pulse oximetry 98% on room air.  GENERAL: No respiratory distress.  EYES: Conjunctivae and lids normal. Pupils equal, round, and reactive to light. Extraocular muscles intact. No nystagmus. Ears, nose, mouth, and throat: Tympanic membranes no erythema. Nasal mucosa: No erythema. Throat: No erythema. No exudate seen. Lips and gums: No lesions.  NECK: No JVD. No bruits. No lymphadenopathy. No thyromegaly. No thyroid nodules palpated.  RESPIRATORY: Lungs are clear to auscultation. No use of accessory muscles to breathe. No rhonchi, rales, or wheeze heard.  CARDIOVASCULAR: S1, S2 normal. Irregular, irregular. No gallops, rubs, or murmurs heard. Carotid upstroke 2+ bilaterally. No bruits. Dorsalis pedal pulses 2+ bilaterally. No edema of the lower extremity.  ABDOMEN: Soft, nontender. No organomegaly/splenomegaly. Normoactive bowel sounds. No masses felt.  LYMPHATIC: No lymph nodes in the neck.  MUSCULOSKELETAL: No clubbing, edema, or cyanosis.  SKIN: No rashes or ulcers seen.  NEUROLOGIC: Cranial nerves II  through XII grossly intact. Deep tendon reflexes 2+ bilateral lower extremities.  PSYCHIATRIC: The patient is oriented to person, place, and time.   LABORATORY AND RADIOLOGICAL DATA: EKG shows atrial fibrillation with rapid ventricular response, 107 beats per minute, nonspecific ST-T wave changes with flattening T waves laterally. Troponin elevated at 0.11. CPK normal. INR 1. White blood cell count 4.0, hemoglobin and hematocrit 12.0 and 37.2, platelet count 202. Glucose 88, BUN 29, creatinine 1.09, sodium 140, potassium 4.0, chloride 106, CO2 25, calcium 9.5. Liver function tests normal range. Magnesium 1.8. TSH 2.85. Chest x-ray showed no evidence of cardiopulmonary disease.   ASSESSMENT AND PLAN:  1.  Unstable angina with elevated troponin and history of coronary artery disease. Will consult cardiology, Dr. Juliann Pares to evaluate the patient for possible cardiac catheter. We will start on aspirin, heparin drip, restart metoprolol and add a nitro paste. Need to rule out myocardial infarction with serial cardiac enzymes and put on telemetry.  2.  Malignant hypertension: Blood pressure elevated. We will restart oral medication since she did not take today and add nitroglycerin patch and continue to monitor closely. All pulses equal throughout, less likely dissection or pulmonary embolus.  3.  Atrial fibrillation, possibly a new diagnosis for the patient. CHADS2 score of 2. We will need to consider anticoagulation on discharge. Right now is on heparin drip. Will obtain an echocardiogram.  4.  Hyperlipidemia. Check a lipid profile. Continue Lipitor  5.  Chronic kidney disease, stage III. We will see if improves with intravenous fluid hydration.  TIME SPENT ON ADMISSION: 55 minutes.   CODE STATUS: The patient is a full code.   ____________________________ Herschell Dimes. Renae Gloss, MD rjw:aw D: 10/20/2012 12:48:33 ET T: 10/20/2012 13:05:44 ET JOB#: [id number]  cc: Herschell Dimes. Renae Gloss, MD, <Dictator> [facility]  Cardiology Salley Scarlet MD ELECTRONICALLY SIGNED 10/21/2012 13:13

## 2015-01-06 NOTE — Discharge Summary (Signed)
PATIENT NAME:  Isabel Molina, Isabel Molina MR#:  [id number] DATE OF BIRTH:  [birth year]  DATE OF ADMISSION:  10/20/2012 DATE OF DISCHARGE:  10/21/2012  For a detailed note, please take a look a the history and physical done on admission by Dr. Alford Highlandichard Wieting.   DISCHARGE DIAGNOSES: 1. Elevated troponin likely in the setting of atrial fibrillation. 2. Atrial fibrillation, new onset. 3. Chest pain, likely related to atrial fibrillation. 4. History of coronary artery disease, hypertension, hyperlipidemia and depression.   DIET: The patient is being discharged on a low sodium, low fat diet.   ACTIVITY: As tolerated.   DISCHARGE FOLLOWUP: In the next 1 to 2 weeks with Dr. Juliann Paresallwood.   DISCHARGE MEDICATIONS: 1. Hydrochlorothiazide 25 mg daily. 2. Sublingual nitroglycerin as needed. 3. Atorvastatin 80 mg daily. 4. Metoprolol succinate 50 mg daily. 5. Amlodipine 5 mg daily. 6. Lisinopril 20 mg daily. 7. Aspirin 81 mg daily.  8. Potassium 20 mEq 2 times a day x 4 days. 9. Xarelto 20 mg daily.   CONSULTANTS: Dorothyann Pengwayne Callwood, MD - Cardiology.   PERTINENT STUDIES: Chest x-ray done on admission showed no evidence of acute cardiopulmonary disease.  Cardiac catheterization done on 10/21/2012 showed 25% stenosis in the proximal LAD, 40% stenosis in the distal LAD, 25% stenosis in the ostial circumflex, 40% stenosis in the ostial RCA, proximal RCA 30% stenosis and distal RCA 25% stenosis. Plan is for medical management.   HOSPITAL COURSE: This is a 81 year old female with medical problems as mentioned above who presented to the hospital with chest pain and mildly elevated troponin.  1. Chest pain with elevated troponin. Initially when the patient presented to the hospital there was a concern for unstable angina/non-ST-elevation MI. The patient was started on heparin drip, maintained on her maintenance medications including aspirin, beta blocker and statin. Cardiology consult was obtained. The patient underwent a  cardiac catheterization, the results of which are stated above. The patient did not have any evidence of significant coronary artery disease resulting in elevated troponin or causing her chest pain. The patient post cath did not have any further exacerbations of her chest pains, she remained hemodynamically stable and therefore was discharged on aspirin, beta blocker and statin as mentioned. She will have close followup with cardiology as an outpatient.  2. New onset atrial fibrillation. The patient has no previous history of afib. She presented in atrial fibrillation rhythm, although the rates were controlled. She will continue Toprol for rate control regarding her afib. I did start her on Xarelto prior to discharge. She will continue followup with Dr. Juliann Paresallwood as an outpatient. Her regular cardiologist is based out of [facility], but in the meantime she will follow up with Dr. Juliann Paresallwood.  3. Hypokalemia. This is probably related to the fact that she is on hydrochlorothiazide. I did discharge her on low dose potassium supplements.  4. History of hypertension. The patient was maintained on her hydrochlorothiazide and Toprol and amlodipine and lisinopril. She will resume that.  5. Hyperlipidemia. The patient was maintained on atorvastatin. She will also resume that upon discharge.   CODE STATUS: FULL CODE.   TIME SPENT: 35 minutes. ____________________________ Rolly PancakeVivek J. Cherlynn KaiserSainani, MD vjs:sb D: 10/22/2012 08:16:53 ET     T: 10/22/2012 08:29:30 ET         JOB#: [id number] cc: Rolly PancakeVivek J. Cherlynn KaiserSainani, MD, <Dictator> Dwayne D. Juliann Paresallwood, MD Houston SirenVIVEK J SAINANI MD ELECTRONICALLY SIGNED 10/28/2012 13:27

## 2015-01-06 NOTE — Consult Note (Signed)
PATIENT NAME:  Isabel Molina, Isabel Molina MR#:  [id number] DATE OF BIRTH:  1931-06-16  DATE OF CONSULTATION:  10/20/2012  REFERRING PHYSICIAN:  Herschell Dimes. Renae Gloss, MD CONSULTING PHYSICIAN:  Waldon Sheerin D. Matilda Fleig, MD  NOTE: The patient usually sees Dr. Vedia Coffer at [facility].   INDICATION: Unstable angina.  HISTORY OF PRESENT ILLNESS: The patient is a 81 year old black female with coronary artery disease, last catheterization about a year ago. The patient had stents in the past. Presents with chest pain and tightness all day long, felt like a heart attack. She has been complaining of cramping in the neck as well radiating to her arms bilaterally, left more than right. She has had a dull ache in her chest, severe at times, 10/10. Right now it has improved to 5/10. It is not related to exertion. She has had episodes of pain-free at times. She has gotten aspirin. She has had some sweating and nausea but no real vomiting, no real shortness of breath, did not take her blood pressure medications. Finally came to the Emergency Room, found to be hypertensive at 180/110. Troponins were borderline elevated, so she was advised to be admitted for further evaluation and care, especially with known coronary disease.   REVIEW OF SYSTEMS: No blackout spells or syncope. She has had nausea but no vomiting. She has had no fever, but she has had some sweats. No weight loss. No weight gain. No hemoptysis or hematemesis. No bright red blood per rectum.   PAST MEDICAL HISTORY: Depression, hypertension, hyperlipidemia, coronary artery disease.   PAST SURGICAL HISTORY: Hysterectomy, bladder tack and angioplasty and stenting.   ALLERGIES: Negative.   MEDICATIONS: Amlodipine 5 a day, aspirin 81 mg a day, atorvastatin 80 a day, HCTZ 25 a day, lisinopril 20 a day, metoprolol ER 50 mg daily, nitroglycerin p.r.n.   SOCIAL HISTORY: No smoking or alcohol consumption. Worked as a Museum/gallery conservator work. Lives with her friend.   FAMILY  HISTORY: Mother died young with hypertension complications, diabetes, amputation, peripheral vascular disease. Father died of colon cancer.   PHYSICAL EXAMINATION:  VITAL SIGNS: Blood pressure 180/100, pulse 85, respiratory rate 16, afebrile.  HEENT: Normocephalic, atraumatic. Pupils equal and reactive to light.  NECK: Supple. No significant JVD, bruits, adenopathy.  LUNGS: Clear to auscultation and percussion. No significant wheezes, rhonchi or rales.  HEART: Regular rate and rhythm. Positive S4. Systolic ejection murmur at left sternal border. PMI does not appear to be displaced.  ABDOMEN: Benign. Positive bowel sounds. No rebound, guarding or tenderness.  EXTREMITY: Within normal limits. No significant cyanosis, clubbing or edema.  NEUROLOGIC: Grossly intact.  SKIN: Normal.   LABORATORY, DIAGNOSTIC AND RADIOLOGICAL DATA: EKG: Atrial fibrillation, rapid ventricular response, rate of about 110, nonspecific ST-T wave changes. Troponin was elevated at 0.11. CKs were normal. INR of 1. White count of 4, hemoglobin 54m hematocrit 37, platelet count 202. Glucose 88, BUN 29, creatinine 1.79, sodium 140, potassium 4.0, chloride 106, CO2 of 25, calcium 9.5. LFTs negative. Magnesium 1.8. TSH 285. Chest x-ray was essentially negative.   ASSESSMENT: Unstable angina, angina, known coronary artery disease, malignant hypertension, atrial fibrillation, history of chronic renal insufficiency, hyperlipidemia, obesity.   PLAN: Agree with admitting and ruling out for myocardial infarction. Consider further evaluation. Consider followup cardiac enzymes and troponins. Place on telemetry. Anticoagulation with heparin for now. Control blood pressure, as she is significantly hypertensive. Atrial fibrillation should be controlled rate-wise as well as for anticoagulation. Would strongly consider cardiac catheterization, especially if the patient rules in  for myocardial infarction, but if symptoms persist and recur, will  probably proceed with cardiac catheterization to evaluate a patient with known coronary disease and multiple risk factors. Hyperlipidemia should continue to be controlled with Lipitor. Consider whether nephrology should be involved with the chronic renal insufficiency.   ____________________________ Bobbie Stackwayne D. Juliann Paresallwood, MD ddc:jm D: 10/20/2012 17:47:42 ET T: 10/20/2012 21:07:17 ET JOB#: [id number]  cc: Carys Malina D. Juliann Paresallwood, MD, <Dictator> Alwyn PeaWAYNE D Armeda Plumb MD ELECTRONICALLY SIGNED 11/06/2012 16:17

## 2015-01-07 NOTE — Discharge Summary (Signed)
PATIENT NAME:  Isabel LauberMARTIN, Solei MR#:  [id number] DATE OF BIRTH:  06-25-1931  DATE OF ADMISSION:  02/15/2014 DATE OF DISCHARGE:  02/17/2014  For a detailed note, please take a look at the history and physical done on admission.   DIAGNOSES AT DISCHARGE: As follows: Malignant/Accelerated HTN. Hypertensive encephalopathy. Elevated troponin likely in the setting of demand ischemia. Early cognitive decline versus dementia.   The patient is being discharged on a low-sodium diet.   ACTIVITY: As tolerated.   FOLLOWUP:  The patient is going to be set up with a new patient appointment with the [facility].   DISCHARGE MEDICATIONS:  Sublingual nitroglycerin as needed, aspirin 81 mg daily, amlodipine 10 mg daily, lisinopril 40 mg daily, ranitidine 150 mg at bedtime, chlorthalidone 25 mg daily and Toprol 50 mg b.i.d.    The patient is being discharged home with home health physical therapy and nursing services.   PERTINENT STUDIES DONE DURING THE HOSPITAL COURSE: Are as follows: A CT scan of the head done on admission showing no acute intracranial abnormality. A 2-dimensional echocardiogram done showing ejection fraction of 75%, moderate LVH.   HOSPITAL COURSE: This is an 81 year old female with medical problems as mentioned above, presented to the hospital due to confusion, altered mental status and significantly elevated blood pressures with systolic blood pressures over [id number] to 250s.  1.  Malignant/Accelerated Hypertension. This is most likely secondary to medical noncompliance. The patient apparently had stopped taking her medications at home about a month and a half ago. Initially, she was admitted to the hospital, started on a sodium nitroprusside drip, although that was quickly weaned off. The patient was then resumed back on her oral meds including Toprol, lisinopril, Norvasc and chlorthalidone. After being initiated on her oral meds, her blood pressure has significantly improved and further needs to be  followed. Her target blood pressure for the next week to 2 weeks should be systolics close to 160, diastolics close to 90. The patient is being arranged with a new patient appointment with [facility], as [facility]  2.  Altered mental status/confusion. This was likely secondary to hypertensive encephalopathy combined with possible signs of early cognitive decline/dementia. Her CT head on admission showed no evidence of acute stroke. Her mental status since admission has improved, but she still has some confusion and spells of sundowning, which is likely related to underlying dementia. She likely needs a full neuropsychiatric evaluation done by her outpatient primary care physician.  3.  History of chronic atrial fibrillation. The patient presented to the hospital in normal sinus rhythm. She will continue Toprol for now. She is currently just on baby aspirin and not on any long-term anticoagulation.    4.  Elevated troponin. This was likely in the setting of demand ischemia from her severe hypertension. Her troponins did not trend upwards. She had no chest pain. Her echocardiogram showed normal LV function with no wall motion abnormalities.   The patient is being discharged with home health services, as mentioned.   Time spent with the discharge is 40 minutes.   ____________________________ Rolly PancakeVivek J. Cherlynn KaiserSainani, MD vjs:dmm D: 02/17/2014 15:34:12 ET T: 02/17/2014 20:27:17 ET JOB#: [id number]  cc: Rolly PancakeVivek J. Cherlynn KaiserSainani, MD, <Dictator> [facility] [place] SirenVIVEK J SAINANI MD ELECTRONICALLY SIGNED 02/19/2014 21:55

## 2015-01-07 NOTE — H&P (Signed)
PATIENT NAME:  Isabel Molina, Isabel Molina MR#:  [id number] DATE OF BIRTH:  1931/01/23  DATE OF ADMISSION:  02/15/2014  PRIMARY CARE PHYSICIAN:  [facility]   CHIEF COMPLAINT: Confusion and uncontrolled blood pressure.   HISTORY OF PRESENT ILLNESS: This is an 81 year old female who presents to the hospital, brought in by her family due to confusion. As per the family, the patient has been more and more confused over the past couple of days. She also has been complaining of a headache for the past few nights. She presented to the hospital and was noted to have systolic blood pressures higher than 250.  Diastolic blood pressures are 120. She was diagnosed with having hypertensive urgency. Hospitalist services were contacted for further treatment and evaluation. The patient denies a headache presently as it has improved. She denies any chest pain, any shortness of breath, any nausea, vomiting. She denies any fevers, chills, cough or any other associated symptoms. Given the fact that her blood pressures are so uncontrolled, hospice services were contacted for further treatment and evaluation.   REVIEW OF SYSTEMS:  CONSTITUTIONAL: No documented fever. No weight gain, no weight loss.  EYES:  Positive blurry vision.  EARS, NOSE, AND THROAT:  No tinnitus. No postnasal drip. No redness of the oropharynx.  RESPIRATORY: No cough, no wheeze, no hemoptysis, no dyspnea.  ARDIOVASCULAR: No chest pain, no orthopnea, no palpitations, no syncope.  GASTROINTESTINAL: Positive nausea. No vomiting. No diarrhea. No abdominal pain. No melena or hematochezia.  GENITOURINARY: No dysuria or hematuria.  ENDOCRINE: No polyuria or nocturia. No heat or cold intolerance.  HEMATOLOGIC: No anemia, no bruising, no bleeding.  INTEGUMENT: No rashes. No lesions.  MUSCULOSKELETAL: No arthritis. No swelling. No gout.  NEUROLOGIC: No numbness or tingling. No ataxia. No seizure type activity.  PSYCHIATRIC: No anxiety, no insomnia. No ADD.    PAST MEDICAL HISTORY: Consistent with hypertension, history of chronic atrial fibrillation, history of coronary Artery disease.   ALLERGIES: No known drug allergies.   SOCIAL HISTORY: No smoking. No alcohol abuse. No illicit drug abuse.  Lives at home with her husband.   FAMILY HISTORY: The patient's mother died from hypertensive complications and also had diabetes. Father died from colon cancer.   CURRENT MEDICATIONS: Are as follows: Amlodipine 10 mg daily, aspirin 81 mg daily, chlorthalidone 25 mg daily, lisinopril 40 mg daily, metoprolol succinate 50 mg daily, sublingual nitroglycerin as needed, ranitidine 150 mg at bedtime, Aldactone 25 mg daily.   PHYSICAL EXAMINATION: As follows:  VITAL SIGNS: Are noted to be temperature 97.8, pulse 68, respirations 18, blood pressure 222/102, saturations 98% on room air.  GENERAL: She is a pleasant-appearing female but in no apparent distress.  HEAD, EYES, EARS, NOSE, AND THROAT: Atraumatic, normocephalic. Extraocular muscles are intact. Pupils are equal and react to light. Sclerae anicteric. No conjunctival injection. No pharyngeal erythema.  NECK: Supple. There is no jugular venous distention, no bruits, no lymphadenopathy, no thyromegaly.  HEART: Regular rate and rhythm. No murmurs, no rubs, no clicks.  LUNGS: Clear to auscultation bilaterally. No rales, no rhonchi, no wheezes.  ABDOMEN: Soft, flat, nontender, nondistended. Has good bowel sounds. No hepatosplenomegaly appreciated.  EXTREMITIES: No evidence of any cyanosis, clubbing, or peripheral edema. Has +2 pedal and radial pulses bilaterally.  NEUROLOGICAL: The patient is alert, awake, and oriented x 2. No focal, motor or sensory deficits appreciated bilaterally.  SKIN: Moist and warm with no rashes appreciated.  LYMPHATIC: There is no cervical or axillary lymphadenopathy.   LABORATORY DATA:  Showed a serum glucose of 90, BUN 15, creatinine 0.9, sodium 139, potassium 3.4, chloride 104,  bicarbonate 28. The patient's LFTs are within normal limits. Troponin 0.09. White cell count 4.3, hemoglobin 12.3, hematocrit 38, platelet count 180,000.   INR is 1.1.   RADIOLOGICAL DATA: The patient did have a CT scan of the head done without contrast which showed no evidence of acute intracranial abnormality.   ASSESSMENT AND PLAN: This is an 81 year old female with a history of hypertension, chronic atrial fibrillation, history of coronary artery disease, who took herself off her blood pressure medications for the past 1-1/2 months, comes in due to confusion and noted to be in hypertensive urgency with systolic blood pressures over 161 and also with hypertensive encephalopathy.  1. Hypertensive urgency. This is likely due to medical noncompliance, as the patient has not taken any of her medications in the past 1-1/2 months. The patient has been started on a sodium nitroprusside drip in the Emergency Room, which I will continue.  I will resume her oral medications including Toprol, lisinopril, Norvasc, and chlorthalidone. I will also add p.r.n. IV hydralazine. Keep her systolic blood pressures close to around 170s and diastolics in the 90s for now. Follow her clinically.  2. Altered mental status and confusion. This is likely hypertensive encephalopathy and should improve as her blood pressure comes down. Her CT head shows no evidence of acute stroke. There is no evidence of any acute infectious or metabolic source of confusion.  3. History of chronic atrial fibrillation. The patient is currently in sinus rhythm. I will continue her Toprol. The patient used to be on Xarelto but has been taken off it by her primary care physician.  4. Elevated troponin. This is likely in the setting of hypertensive urgency. The patient has no acute chest pain. I will observe her on telemetry, cycle her cardiac markers, get a 2-dimensional echocardiogram. Continue aspirin and beta blocker for now.   CODE STATUS: The  patient is a full code.   CRITICAL CARE TIME SPENT:  50 minutes.     ____________________________ Rolly Pancake. Cherlynn Kaiser, MD vjs:dd D: 02/15/2014 19:13:30 ET T: 02/15/2014 19:57:36 ET JOB#: [id number]  cc: Rolly Pancake. Cherlynn Kaiser, MD, <Dictator> [place] Siren MD ELECTRONICALLY SIGNED 02/19/2014 21:55

## 2015-01-15 NOTE — H&P (Signed)
PATIENT NAME:  Isabel Molina, TOP MR#:  [id number] DATE OF BIRTH:  [birth year]  DATE OF ADMISSION:  11/15/2014  REFERRING PHYSICIAN: Coolidge Breeze, MD   PRIMARY CARE PHYSICIAN:  Dr. Ferdinand Lango of [facility].   CHIEF COMPLAINT: Confusion.   HISTORY OF PRESENT ILLNESS: An 81 year old African American female with a history of essential hypertension, coronary artery disease, status post PCI and stent placement, presenting with altered mental status described as confusion. The patient herself is unable to provide any meaningful information. When asked why she is here she states "I thought I needed surgery." History obtained from daughter who is present at bedside, states that she has been having worsening confusion for the last 3 days. Of note, she has been off her blood pressure medications for about 1 week in total. Given the worsening confusion saw PCP today for the above symptoms, noted to be markedly hypertensive, blood pressure 216/79 thus sent to the Emergency Department further workup and evaluation. Once again, the patient is an unreliable historian given her mental status and medical condition.   REVIEW OF SYSTEMS: Unobtainable given the patient's mental status and medical condition.   PAST MEDICAL HISTORY: Includes essential hypertension, paroxysmal atrial fibrillation, coronary artery disease, status post PCI and stent placement.   SOCIAL HISTORY: No alcohol, tobacco, or drug usage. Lives with her son. According to daughter, she does have some mild baseline confusion, however, it is markedly pronounced now.    FAMILY HISTORY:  Hypertension and diabetes.   ALLERGIES: No known drug allergies.   HOME MEDICATIONS: Aspirin 81 mg p.o. daily, lisinopril 40 mg p.o. daily, nitroglycerin 0.4 mg sublingual every 5 minutes as needed for chest pain, metoprolol 50 mg p.o. b.i.d., Norvasc 10 mg p.o. daily, chlorthalidone 25 mg p.o. daily, ranitidine 150 mg p.o. b.i.d.   PHYSICAL EXAMINATION:   VITAL SIGNS: Temperature 98.7, heart rate 64, respirations 20, blood pressure 189/85, saturating 98% on room air, weight 69.4 kg, BMI 25.5.  GENERAL: Well nourished, well developed African American female, currently in minimal to moderate distress given mental status.  HEAD: Normocephalic, atraumatic.  EYES: Pupils equal, round, reactive to light. Extraocular muscles intact. No scleral icterus.  MOUTH: Moist mucosal membrane. Dentition intact. No abscess noted.  EAR, NOSE, THROAT: Clear without exudates. No external lesions.  NECK: Supple. No thyromegaly. No nodules. No JVD.  PULMONARY: Clear to auscultation bilaterally without wheezes, rales, or rhonchi. No use of accessory muscles. Good respiratory effort.  CHEST: Nontender to palpation.  CARDIOVASCULAR: S1, S2, regular rate and rhythm. No murmurs, rubs, or gallops. No edema. Pedal pulses 2+ bilaterally. GASTROINTESTINAL: Soft, nontender, nondistended. No masses. Positive bowel sounds. No hepatosplenomegaly.  MUSCULOSKELETAL: No swelling, clubbing, edema. Range of motion full in all extremities.  NEUROLOGIC: Unable to fully assess given patient's mental status and her inability to follow simple commands, however, she does have spontaneous movements of all extremities.   SKIN: No ulceration, lesions, rashes, or cyanosis. Skin warm and dry. Turgor intact.  PSYCHIATRIC: Mood and affect: She is awake, alert, oriented to person only, not to place or time. Insight and judgment poor at this time.   LABORATORY DATA: Sodium 140, potassium 3.3, chloride 104, bicarbonate 26, BUN 24, creatinine 1.19, glucose of 88, albumin of 3.3. Otherwise, LFTs within normal limits. Troponin of 0.09, WBC of 5.6, hemoglobin 11.7, platelets of 250. Urinalysis within normal limits. CT head performed, which reveals no intracranial process.   ASSESSMENT AND PLAN: An 81 year old African American female with a history of essential hypertension, coronary  artery disease, status  post percutaneous coronary intervention and stent placement presenting with altered mental status.  1.  Hypertensive urgency.  Place on telemetry. Initiate hydralazine p.r.n. We will restart her home medications as  once again she has been without these for about 1 week in total duration. Goal blood pressures will be systolic less than 180.  2.  Encephalopathy. Neuro checks q. 4 hours. Question if this is related to hypertensive encephalopathy given blood pressure. If blood pressure improves and no improvement of mental status, we will need an MRI of the brain.  3.  Hypokalemia. Replace with a goal of 4.  4.  Elevated troponin likely related to demand given marked hypertension. Regardless, place on telemetry, trend cardiac enzymes x 3.  5.  Gastroesophageal reflux disease without esophagitis. Continue with H2 blockers.    6.  Venous thromboembolism prophylaxis with heparin subcutaneously.   CODE STATUS: The patient is full code.   TIME SPENT: 45 minutes.   ____________________________ Cletis Athensavid K. Zola Runion, MD dkh:AT D: 11/15/2014 00:41:54 ET T: 11/15/2014 04:06:43 ET JOB#: [id number]  cc: Cletis Athensavid K. Derran Sear, MD, <Dictator> [facility]bee Mamula Synetta ShadowK Saidah Kempton MD ELECTRONICALLY SIGNED 11/15/2014 20:32

## 2015-01-15 NOTE — Discharge Summary (Signed)
Dates of Admission and Diagnosis:  Date of Admission 15-Nov-2014   Date of Discharge 17-Nov-2014   Admitting Diagnosis confusion   Final Diagnosis 1. Hypertensive encephalopathy 2. Worsening vascular dementia 3. Accelerated hypertension  4. Reflux    Chief Complaint/History of Present Illness CHIEF COMPLAINT: Confusion.   HISTORY OF PRESENT ILLNESS: An 81 year old African American female with a history of essential hypertension, coronary artery disease, status post PCI and stent placement, presenting with altered mental status described as confusion. The patient herself is unable to provide any meaningful information. When asked why she is here she states "I thought I needed surgery.??? History obtained from daughter who is present at bedside, states that she has been having worsening confusion for the last 3 days. Of note, she has been off her blood pressure medications for about 1 week in total. Given the worsening confusion saw PCP today for the above symptoms, noted to be markedly hypertensive, blood pressure 216/79 thus sent to the Emergency Department further workup and evaluation. Once again, the patient is an unreliable historian given her mental status and medical condition.   REVIEW OF SYSTEMS: Unobtainable given the patient's mental status and medical condition.   Allergies:  No Known Allergies:   Pertinent Past History:  Pertinent Past History PAST MEDICAL HISTORY: Includes essential hypertension, paroxysmal atrial fibrillation, coronary artery disease, status post PCI and stent placement.   Hospital Course:  Hospital Course 81 year old female with a history of hypertension, chronic atrial fibrillation, history of coronary artery disease,  off her blood pressure medications comes in due to confusion and noted to be in hypertensive urgency with systolic blood pressures over 161 and also with hypertensive encephalopathy.   1.Hypertensive urgency due to not being any of her  medications- Improving - remains on Toprol, Lisinopril, Norvasc. Add chlorthalidone.  2.  Acute hypertensive encephalopathy - Likely has underlying vascular dementia - likely hypertensive encephalopathy and should improve as her blood pressure comes down. Her CT head shows no evidence of acute stroke.  - Still confused. Has worsening vascular dementia - Checked MRI brain. Showed artificat right occipital lobe. Infarct not favoured.  3.History of chronic atrial fibrillation.  - currently in sinus rhythm. cont. Toprol. - not on anticoagulation presently due to fall risk from gait abnormalities. primary care provider to discuss regarding this when she improves.  4. Elevated troponin. This is likely in the setting of hypertensive urgency.  - troponin's did not trend upward.  Echo shows normal LV function.  Time spent on discharge 40 minutes   Condition on Discharge Fair   Code Status:  Code Status Full Code   PHYSICAL EXAM ON DISCHARGE:  Physical Exam:  GEN no acute distress   RESP normal resp effort  clear BS   CARD regular rate  murmur present   LYMPH negative neck   SKIN normal to palpation   PSYCH alert, poor insight   VITAL SIGNS:  Vital Signs: **Vital Signs.:   03-Mar-16 08:39  Vital Signs Type Routine  Celsius 36.6  Pulse Pulse 56  Respirations Respirations 18  Systolic BP Systolic BP 116  Diastolic BP (mmHg) Diastolic BP (mmHg) 67  Mean BP 83  Pulse Ox % Pulse Ox % 97  Pulse Ox Activity Level  At rest  Oxygen Delivery Room Air/ 21 %   DISCHARGE INSTRUCTIONS HOME MEDS:  Medication Reconciliation: Patient's Home Medications at Discharge:     Medication Instructions  amlodipine 10 mg oral tablet  1 tab(s) orally once a day (in the  morning)   aspirin 81 mg oral tablet  1 tab(s) orally once a day (in the morning)   metoprolol tartrate 50 mg oral tablet  1 tab(s) orally 2 times a day   ranitidine 150 mg oral tablet  1 tab(s) orally 2 times a day    lisinopril 40 mg oral tablet  1 tab(s) orally once a day (in the morning)   chlorthalidone 50 mg oral tablet  1 tab(s) orally once a day   potassium chloride 20 meq oral tablet, extended release  1 tab(s) orally once a day    STOP TAKING THE FOLLOWING MEDICATION(S):    nitroglycerin 0.4 mg sublingual tablet: 1 tab(s) sublingual every 5 minutes, As Needed- for Chest Pain   Physician's Instructions:  Diet Low Sodium   Activity Limitations As tolerated   Return to Work Not Applicable   Time frame for Follow Up Appointment 1-2 days  Physician at rehab   Electronic Signatures: Pragya Lofaso, Andreas BlowerSrikar Reddy (MD)  (Signed 03-Mar-16 14:28)  Authored: ADMISSION DATE AND DIAGNOSIS, CHIEF COMPLAINT/HPI, Allergies, PERTINENT PAST HISTORY, HOSPITAL COURSE, PHYSICAL EXAM ON DISCHARGE, VITAL SIGNS, DISCHARGE INSTRUCTIONS HOME MEDS, PATIENT INSTRUCTIONS   Last Updated: 03-Mar-16 14:28 by Minette HeadlandSudini, Donjuan Robison Reddy (MD)

## 2015-04-28 IMAGING — CT CT HEAD WITHOUT CONTRAST
1 series · 16 of 30 positions shown, 20 images · non-contrast
Comparison: None.

CLINICAL DATA: Headache.  Mental status changes.

EXAM:
CT HEAD WITHOUT CONTRAST
TECHNIQUE: Contiguous axial images were obtained from the base of the skull
through the vertex without intravenous contrast.

[Series 2: head wo · axial · 0.40mm/px · z∈[-92,+60]mm · 16 of 36 slices shown, 20 images]
[im 2/36  brain]
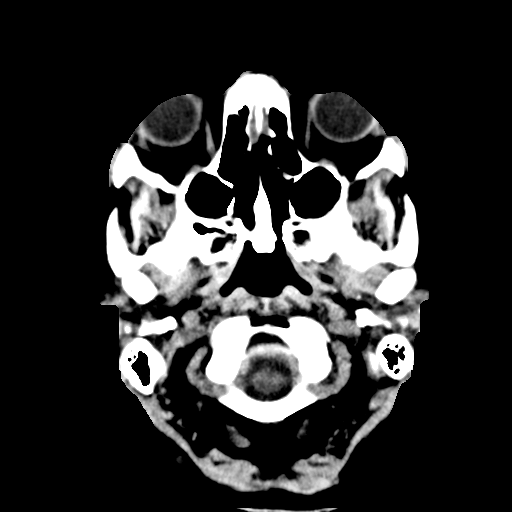
[im 2/36  bone]
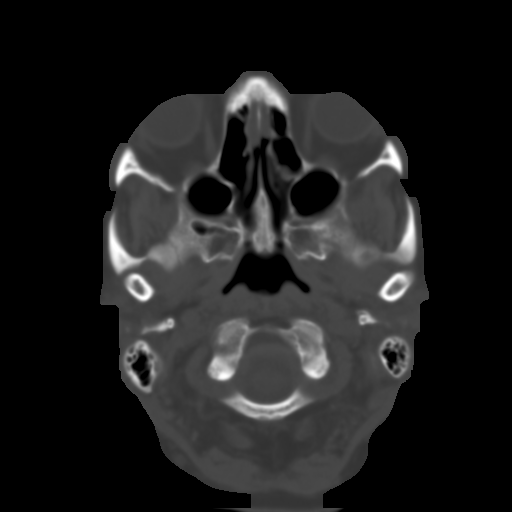
[im 4/36  brain]
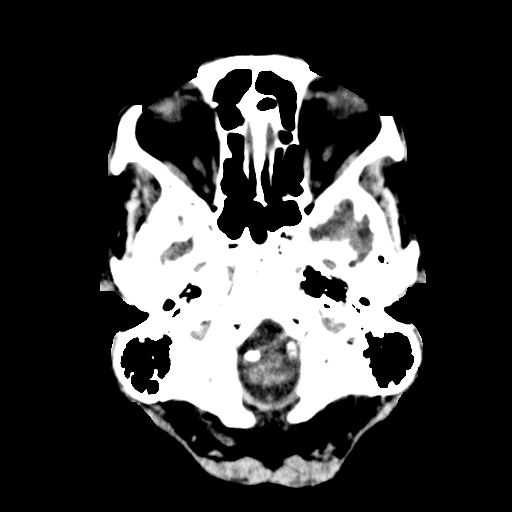
[im 7/36  brain]
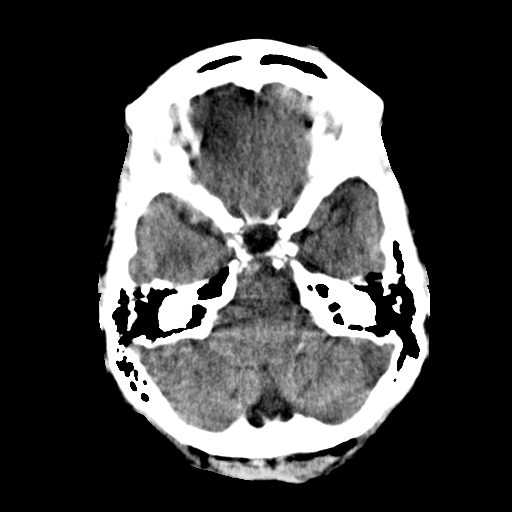
[im 9/36  brain]
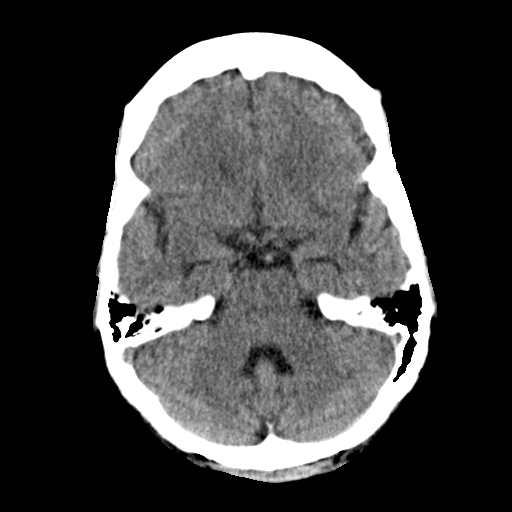
[im 10/36  brain]
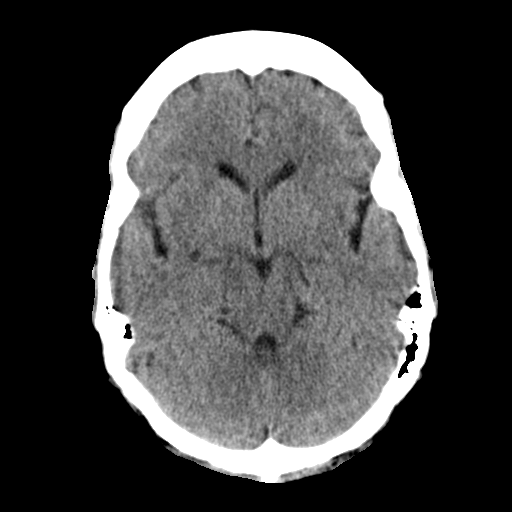
[im 10/36  bone]
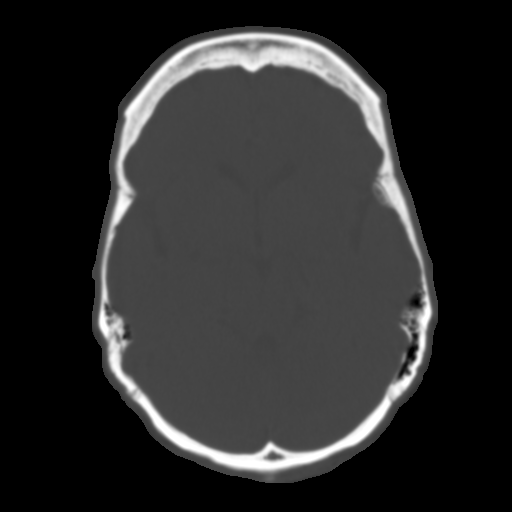
[im 13/36  brain]
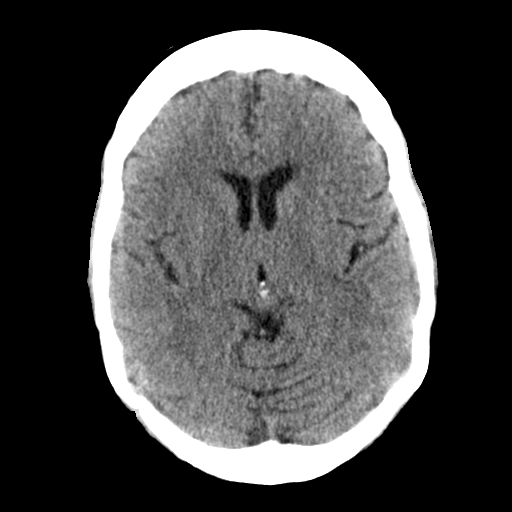
[im 15/36  brain]
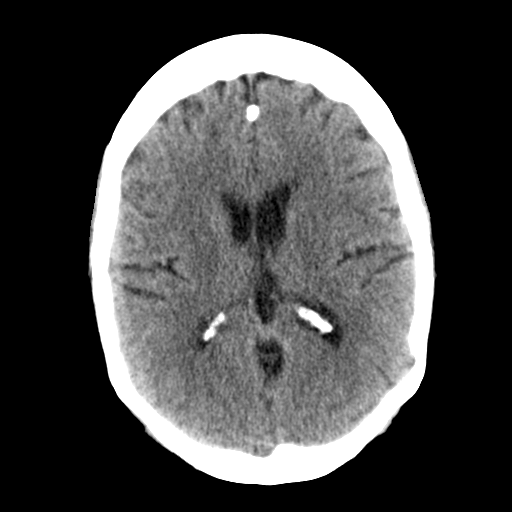
[im 17/36  brain]
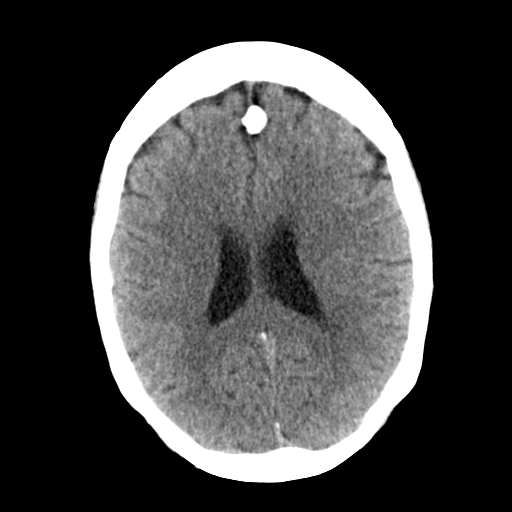
[im 19/36  brain]
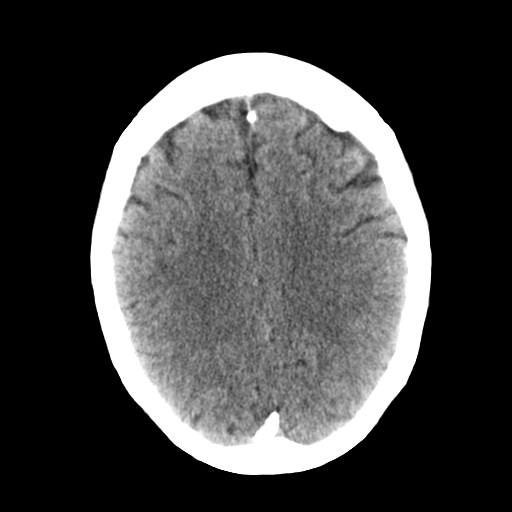
[im 19/36  bone]
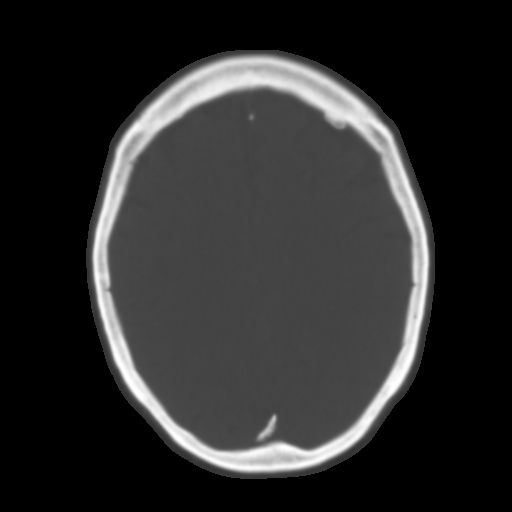
[im 21/36  brain]
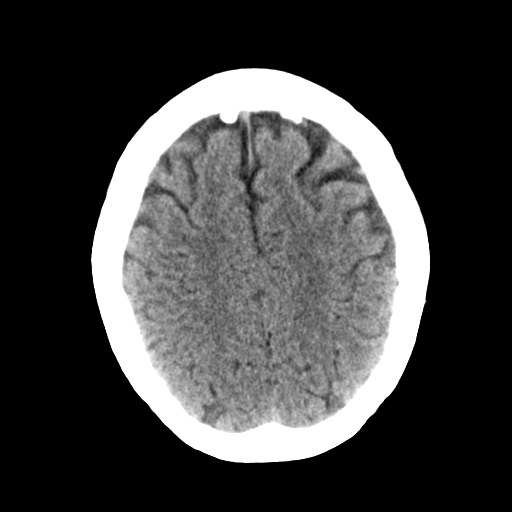
[im 23/36  brain]
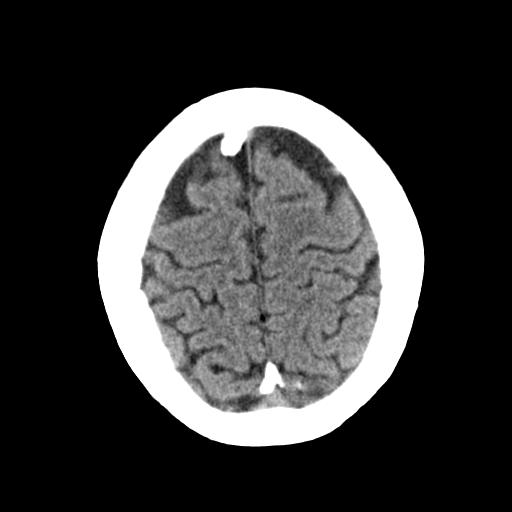
[im 26/36  brain]
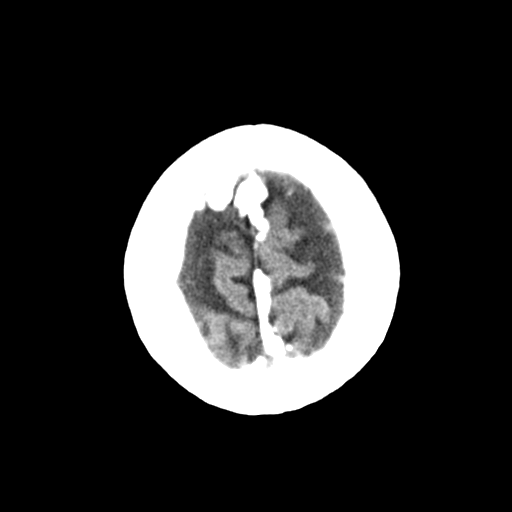
[im 27/36  brain]
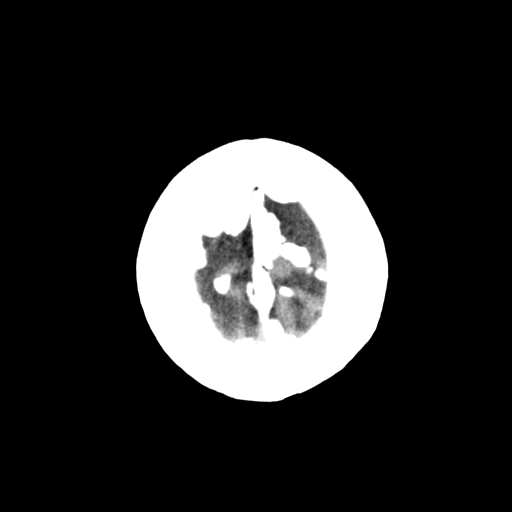
[im 27/36  bone]
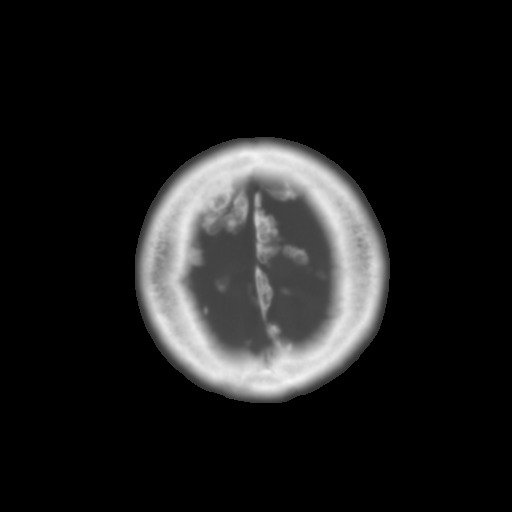
[im 29/36  brain]
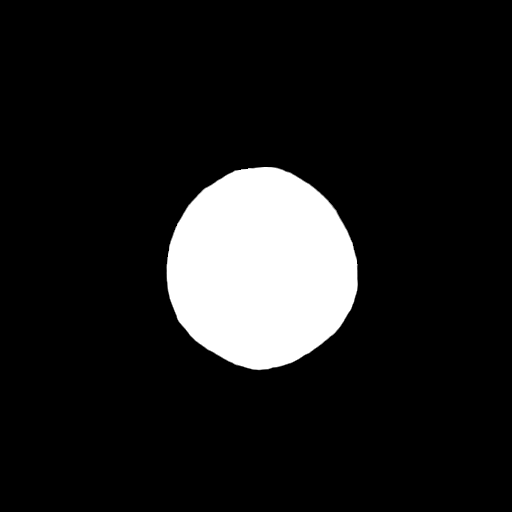
[im 32/36  brain]
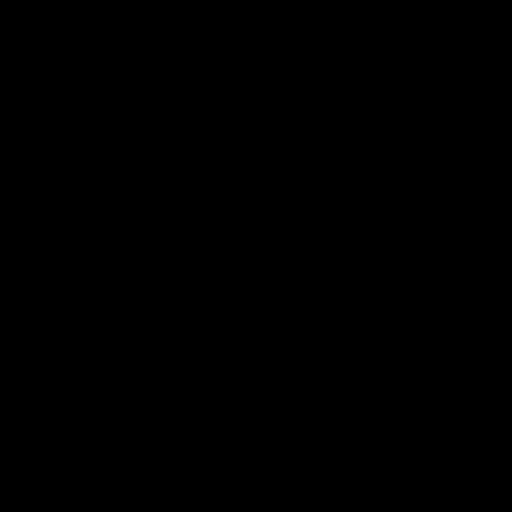
[im 34/36  brain]
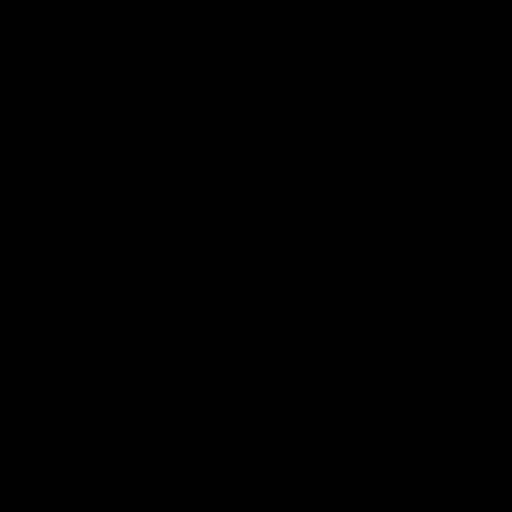

[16 of 30 positions shown; findings below may reference images not displayed]

FINDINGS: Sinuses/Soft tissues: Clear paranasal sinuses and mastoid air cells.
Hyperostosis frontalis interna. Aerated petrous apices.

Intracranial: Vertebral and carotid atherosclerosis bilaterally. No
mass lesion, hemorrhage, hydrocephalus, acute infarct, intra-axial,
or extra-axial fluid collection.
IMPRESSION: No acute intracranial abnormality.

## 2015-06-13 ENCOUNTER — Emergency Department
Admission: EM | Admit: 2015-06-13 | Discharge: 2015-06-13 | Disposition: A | Discharge status: Home / Self Care | Payer: Medicare Other | Attending: Emergency Medicine | Admitting: Emergency Medicine

## 2015-06-13 ENCOUNTER — Emergency Department: Payer: Medicare Other

## 2015-06-13 DIAGNOSIS — R079 Chest pain, unspecified: Secondary | ICD-10-CM | POA: Diagnosis not present

## 2015-06-13 DIAGNOSIS — Z79899 Other long term (current) drug therapy: Secondary | ICD-10-CM | POA: Diagnosis not present

## 2015-06-13 DIAGNOSIS — Z7982 Long term (current) use of aspirin: Secondary | ICD-10-CM | POA: Diagnosis not present

## 2015-06-13 DIAGNOSIS — I10 Essential (primary) hypertension: Secondary | ICD-10-CM | POA: Insufficient documentation

## 2015-06-13 LAB — COMPREHENSIVE METABOLIC PANEL
ALBUMIN: 4.1 g/dL (ref 3.5–5.0)
ALT: 9 U/L — AB (ref 14–54)
AST: 17 U/L (ref 15–41)
Alkaline Phosphatase: 82 U/L (ref 38–126)
Anion gap: 9 (ref 5–15)
BUN: 29 mg/dL — AB (ref 6–20)
CHLORIDE: 99 mmol/L — AB (ref 101–111)
CO2: 27 mmol/L (ref 22–32)
CREATININE: 1.27 mg/dL — AB (ref 0.44–1.00)
Calcium: 9.5 mg/dL (ref 8.9–10.3)
GFR calc Af Amer: 44 mL/min — ABNORMAL LOW (ref >60)
GFR calc non Af Amer: 38 mL/min — ABNORMAL LOW (ref >60)
GLUCOSE: 93 mg/dL (ref 65–99)
Potassium: 3.4 mmol/L — ABNORMAL LOW (ref 3.5–5.1)
SODIUM: 135 mmol/L (ref 135–145)
Total Bilirubin: 0.5 mg/dL (ref 0.3–1.2)
Total Protein: 8 g/dL (ref 6.5–8.1)

## 2015-06-13 LAB — CBC WITH DIFFERENTIAL/PLATELET
Basophils Absolute: 0.1 10*3/uL (ref 0–0.1)
Basophils Relative: 1 %
EOS ABS: 0.2 10*3/uL (ref 0–0.7)
Eosinophils Relative: 4 %
HCT: 35.9 % (ref 35.0–47.0)
HEMOGLOBIN: 11.8 g/dL — AB (ref 12.0–16.0)
LYMPHS ABS: 1.1 10*3/uL (ref 1.0–3.6)
Lymphocytes Relative: 28 %
MCH: 25.8 pg — AB (ref 26.0–34.0)
MCHC: 32.9 g/dL (ref 32.0–36.0)
MCV: 78.6 fL — ABNORMAL LOW (ref 80.0–100.0)
MONO ABS: 0.3 10*3/uL (ref 0.2–0.9)
MONOS PCT: 9 %
NEUTROS PCT: 58 %
Neutro Abs: 2.4 10*3/uL (ref 1.4–6.5)
Platelets: 206 10*3/uL (ref 150–440)
RBC: 4.56 MIL/uL (ref 3.80–5.20)
RDW: 16 % — ABNORMAL HIGH (ref 11.5–14.5)
WBC: 4.1 10*3/uL (ref 3.6–11.0)

## 2015-06-13 LAB — TROPONIN I

## 2015-06-13 MED ORDER — LABETALOL HCL 5 MG/ML IV SOLN
20.0000 mg | Freq: Once | INTRAVENOUS | Status: AC
  Administered 2015-06-13: 20 mg via INTRAVENOUS
  Filled 2015-06-13: qty 4

## 2015-06-13 MED ORDER — CLONIDINE HCL 0.2 MG PO TABS: 0.2000 mg | ORAL_TABLET | Freq: Two times a day (BID) | ORAL | Status: DC | PRN

## 2015-06-13 NOTE — ED Provider Notes (Signed)
Time Seen: Approximately 1545  I have reviewed the triage notes  Chief Complaint: Hypertension   History of Present Illness: Isabel Molina is a 81 y.o. female who [facility] of high blood pressure. Patient has been on her typical blood pressure medications. Patient has describes some intermittent transient right-sided chest discomfort with no other associated symptoms since yesterday. She "" felt bad" and stated something to the staff that her blood pressure may be elevated. It seems that she's had similar symptoms before when she gets hypertension. Patient has a history of chronic atrial fibrillation was recently seen by a cardiologist with no adjustments on her blood pressure medication and no recommendation for his overall toe was added. Patient denies any new focal weakness, headache, abdominal pain, peripheral edema, or any other new concerns   Past Medical History  Diagnosis Date  . Chronic a-fib   . Hypertension   . CAD (coronary artery disease)     There are no active problems to display for this patient.   Past Surgical History  Procedure Laterality Date  . Coronary angioplasty with stent placement  X 3  . Nasal sinus surgery    . Abdominal hysterectomy    . Cholecystectomy      Past Surgical History  Procedure Laterality Date  . Coronary angioplasty with stent placement  X 3  . Nasal sinus surgery    . Abdominal hysterectomy    . Cholecystectomy      Current Outpatient Rx  Name  Route  Sig  Dispense  Refill  . amLODipine (NORVASC) 10 MG tablet   Oral   Take 10 mg by mouth daily.         Marland Kitchen aspirin EC 81 MG tablet   Oral   Take 81 mg by mouth daily.         . chlorthalidone (HYGROTON) 25 MG tablet   Oral   Take 25 mg by mouth daily.         Marland Kitchen lisinopril (PRINIVIL,ZESTRIL) 40 MG tablet   Oral   Take 40 mg by mouth daily.         . metoprolol (LOPRESSOR) 50 MG tablet   Oral   Take 50 mg by mouth  2 (two) times daily.         . nitroGLYCERIN (NITROSTAT) 0.4 MG SL tablet   Sublingual   Place 0.4 mg under the tongue every 5 (five) minutes as needed for chest pain.         . ranitidine (ZANTAC) 150 MG capsule   Oral   Take 150 mg by mouth 2 (two) times daily.           Allergies:  Review of patient's allergies indicates no known allergies.  Family History: No family history on file.  Social History: Social History  Substance Use Topics  . Smoking status: Never Smoker   . Smokeless tobacco: None  . Alcohol Use: No     Review of Systems:   10 point review of systems was performed and was otherwise negative:  Constitutional: No fever Eyes: No visual disturbances ENT: No sore throat, ear pain Cardiac: No chest pain Respiratory: No shortness of breath, wheezing, or stridor Abdomen: No abdominal pain, no vomiting, No diarrhea Endocrine: No weight loss, No night sweats Extremities: No peripheral edema, cyanosis Skin: No rashes, easy bruising Neurologic: No focal weakness, trouble with speech or swollowing Urologic: No dysuria, Hematuria, or urinary frequency  Physical Exam:  ED Triage Vitals  Enc Vitals Group     BP 06/13/15 1527 239/87 mmHg     Pulse Rate 06/13/15 1527 62     Resp 06/13/15 1527 18     Temp 06/13/15 1527 97.9 F (36.6 C)     Temp Source 06/13/15 1527 Oral     SpO2 06/13/15 1527 95 %     Weight 06/13/15 1527 128 lb (58.06 kg)     Height 06/13/15 1527  (1.651 m)     Head Cir --      Peak Flow --      Pain Score 06/13/15 1528 8     Pain Loc --      Pain Edu? --      Excl. in GC? --     General: Awake , Alert , and Oriented times 3; GCS 15 Head: Normal cephalic , atraumatic Eyes: Pupils equal , round, reactive to light Nose/Throat: No nasal drainage, patent upper airway without erythema or exudate.  Neck: Supple, Full range of motion, No anterior adenopathy or palpable thyroid masses Lungs: Clear to ascultation without  wheezes , rhonchi, or rales Heart: Regular rate, regular rhythm without murmurs , gallops , or rubs Abdomen: Soft, non tender without rebound, guarding , or rigidity; bowel sounds positive and symmetric in all 4 quadrants. No organomegaly .        Extremities: 2 plus symmetric pulses. No edema, clubbing or cyanosis Neurologic: normal ambulation, Motor symmetric without deficits, sensory intact Skin: warm, dry, no rashes No obvious reproducible chest wall pain  Labs:   All laboratory work was reviewed including any pertinent negatives or positives listed below:  Labs Reviewed  COMPREHENSIVE METABOLIC PANEL  CBC WITH DIFFERENTIAL/PLATELET  TROPONIN I    EKG:  ED ECG REPORT I, Jennye Moccasin, the attending physician, personally viewed and interpreted this ECG.  Date: 06/13/2015 EKG Time: 1535 Rate: 71 Rhythm: normal sinus rhythm with sinus arrhythmia QRS Axis: Left ventricular hypertrophy Intervals: normal ST/T Wave abnormalities: normal Conduction Disutrbances: none Narrative Interpretation: unremarkable No acute ischemic changes noted   Radiology: *   EXAM: CHEST 2 VIEW  COMPARISON: October 20, 2012.  FINDINGS: The heart size and mediastinal contours are within normal limits. Both lungs are clear. No pneumothorax or pleural effusion is noted. Multilevel degenerative disc disease is noted in the lower thoracic spine.  IMPRESSION: No active cardiopulmonary disease.  I personally reviewed the radiologic studies     ED Course:  Patient's stay here showed a decrease in her blood pressure with 20 mg of IV labetalol. Patient's of mild chest discomfort that she had present seemed to resolve. I felt given her current clinical presentation and objective findings this most likely was hypertensive urgency. The patient's EKG shows no ischemic changes and troponin is negative and I felt this was unlikely to be cardiovascular chest pain o such as unstable angina or aortic  dissection. All questions and concerns were addressed at the bedside and they were advised to check the patient's blood pressure at home on a regular basis and record the numbers. They've been advised to follow up with her primary physician and/or cardiologist for any future adjustments of her medication. She was also prescribed clonidine for hypertensive situations.     Assessment: Hypertensive urgency  Final Clinical Impression:  Hypertensive urgency Final diagnoses:  None     Plan:  Patient was advised to return immediately if condition worsens. Patient was advised to follow up with  her primary care physician or other specialized physicians involved and in their current assessment. Outpatient management            Jennye Moccasin, MD 06/13/15 [phone]

## 2015-06-13 NOTE — ED Notes (Signed)
Pt states she was at [facility] for initial visit and was told to come to the ED due to b/p being elevated 200 systolic.the patient also c/o right side and central chest pain for the past 2 days.the patient is in NAD at present, skin is warm and dry, respirations WNL.

## 2015-06-13 NOTE — Discharge Instructions (Signed)
Hypertension °Hypertension, commonly called high blood pressure, is when the force of blood pumping through your arteries is too strong. Your arteries are the blood vessels that carry blood from your heart throughout your body. A blood pressure reading consists of a higher number over a lower number, such as 110/72. The higher number (systolic) is the pressure inside your arteries when your heart pumps. The lower number (diastolic) is the pressure inside your arteries when your heart relaxes. Ideally you want your blood pressure below 120/80. °Hypertension forces your heart to work harder to pump blood. Your arteries may become narrow or stiff. Having hypertension puts you at risk for heart disease, stroke, and other problems.  °RISK FACTORS °Some risk factors for high blood pressure are controllable. Others are not.  °Risk factors you cannot control include:  °· Race. You may be at higher risk if you are African American. °· Age. Risk increases with age. °· Gender. Men are at higher risk than women before age 45 years. After age 65, women are at higher risk than men. °Risk factors you can control include: °· Not getting enough exercise or physical activity. °· Being overweight. °· Getting too much fat, sugar, calories, or salt in your diet. °· Drinking too much alcohol. °SIGNS AND SYMPTOMS °Hypertension does not usually cause signs or symptoms. Extremely high blood pressure (hypertensive crisis) may cause headache, anxiety, shortness of breath, and nosebleed. °DIAGNOSIS  °To check if you have hypertension, your health care provider will measure your blood pressure while you are seated, with your arm held at the level of your heart. It should be measured at least twice using the same arm. Certain conditions can cause a difference in blood pressure between your right and left arms. A blood pressure reading that is higher than normal on one occasion does not mean that you need treatment. If one blood pressure reading  is high, ask your health care provider about having it checked again. °TREATMENT  °Treating high blood pressure includes making lifestyle changes and possibly taking medicine. Living a healthy lifestyle can help lower high blood pressure. You may need to change some of your habits. °Lifestyle changes may include: °· Following the DASH diet. This diet is high in fruits, vegetables, and whole grains. It is low in salt, red meat, and added sugars. °· Getting at least 2½ hours of brisk physical activity every week. °· Losing weight if necessary. °· Not smoking. °· Limiting alcoholic beverages. °· Learning ways to reduce stress. ° If lifestyle changes are not enough to get your blood pressure under control, your health care provider may prescribe medicine. You may need to take more than one. Work closely with your health care provider to understand the risks and benefits. °HOME CARE INSTRUCTIONS °· Have your blood pressure rechecked as directed by your health care provider.   °· Take medicines only as directed by your health care provider. Follow the directions carefully. Blood pressure medicines must be taken as prescribed. The medicine does not work as well when you skip doses. Skipping doses also puts you at risk for problems.   °· Do not smoke.   °· Monitor your blood pressure at home as directed by your health care provider.  °SEEK MEDICAL CARE IF:  °· You think you are having a reaction to medicines taken. °· You have recurrent headaches or feel dizzy. °· You have swelling in your ankles. °· You have trouble with your vision. °SEEK IMMEDIATE MEDICAL CARE IF: °· You develop a severe headache or confusion. °·   You have unusual weakness, numbness, or feel faint. °· You have severe chest or abdominal pain. °· You vomit repeatedly. °· You have trouble breathing. °MAKE SURE YOU:  °· Understand these instructions. °· Will watch your condition. °· Will get help right away if you are not doing well or get worse. °Document  Released: 09/02/2005 Document Revised: 01/17/2014 Document Reviewed: 06/25/2013 °[employer]® Patient Information ©2015 [employer]. This information is not intended to replace advice given to you by your health care provider. Make sure you discuss any questions you have with your health care provider. ° °Please return immediately if condition worsens. Please contact her primary physician or the physician you were given for referral. If you have any specialist physicians involved in her treatment and plan please also contact them. Thank you for using [place] [facility]. ° °

## 2016-01-25 IMAGING — CT CT HEAD WITHOUT CONTRAST
1 series · 16 of 30 positions shown, 20 images · non-contrast
Comparison: 02/15/2014, 08/12/2006

CLINICAL DATA: Confusion, hypertension

EXAM:
CT HEAD WITHOUT CONTRAST
TECHNIQUE: Contiguous axial images were obtained from the base of the skull
through the vertex without intravenous contrast.

[Series 2: head wo · axial · 0.42mm/px · z∈[+212,+338]mm · 16 of 32 slices shown, 20 images]
[im 2/32  brain]
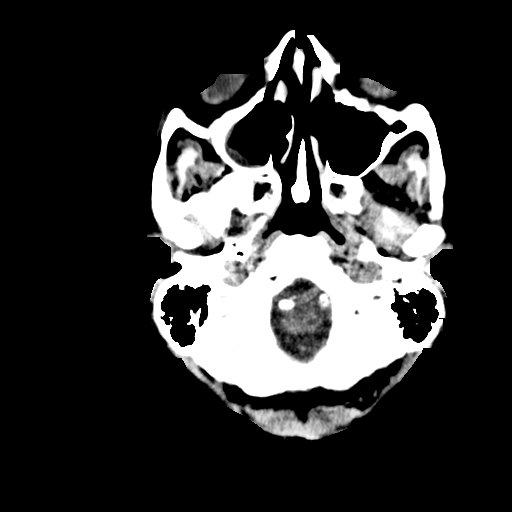
[im 2/32  bone]
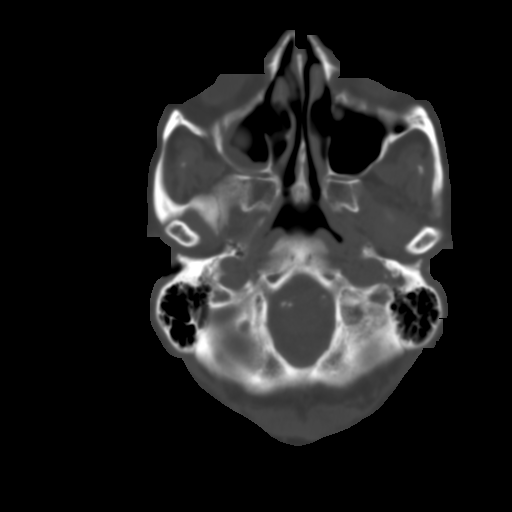
[im 4/32  brain]
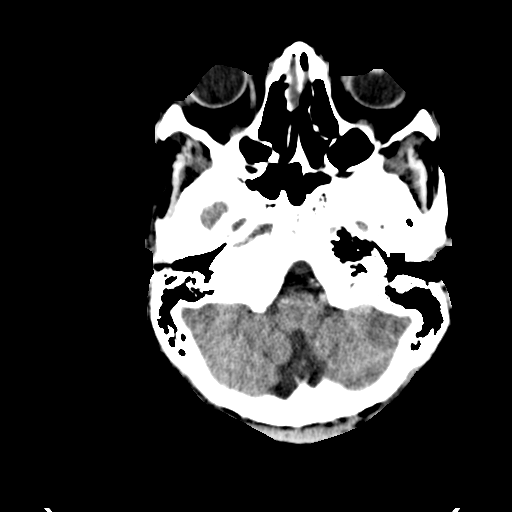
[im 6/32  brain]
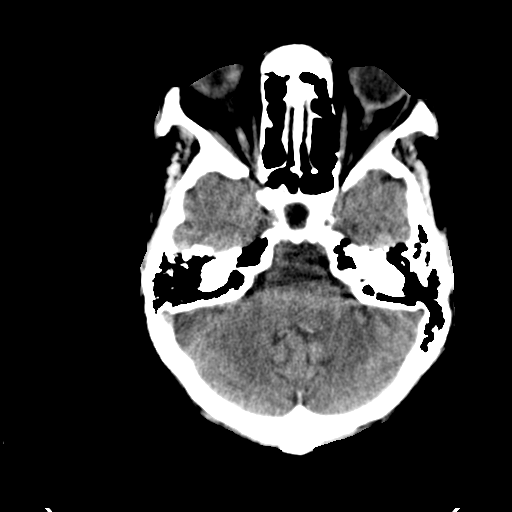
[im 8/32  brain]
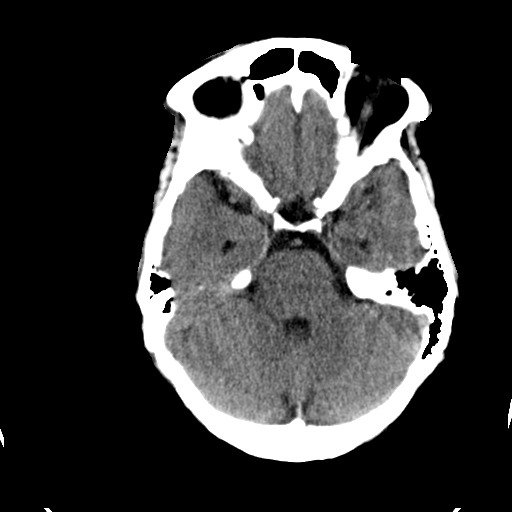
[im 9/32  brain]
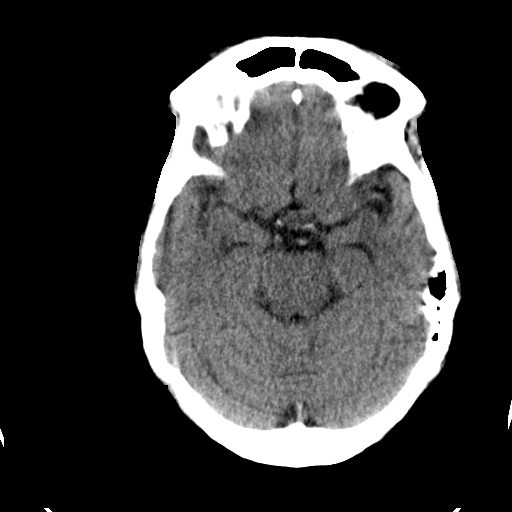
[im 9/32  bone]
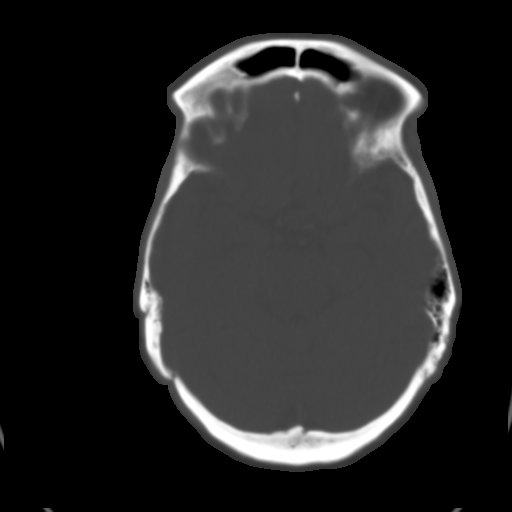
[im 11/32  brain]
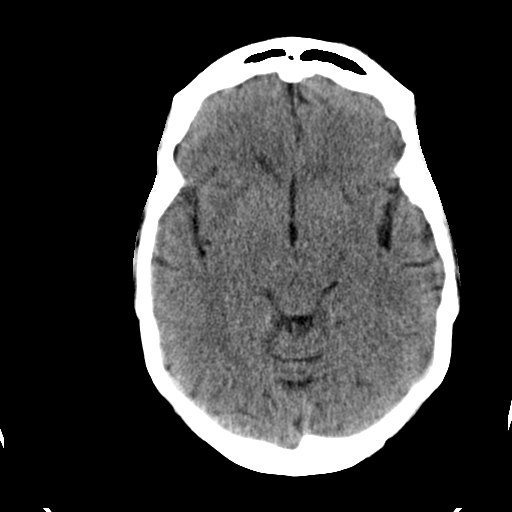
[im 13/32  brain]
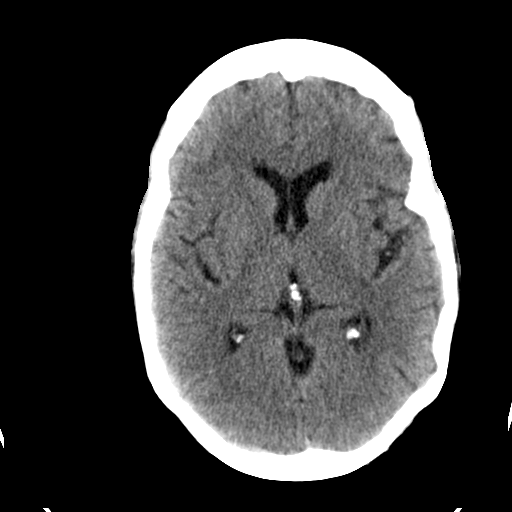
[im 15/32  brain]
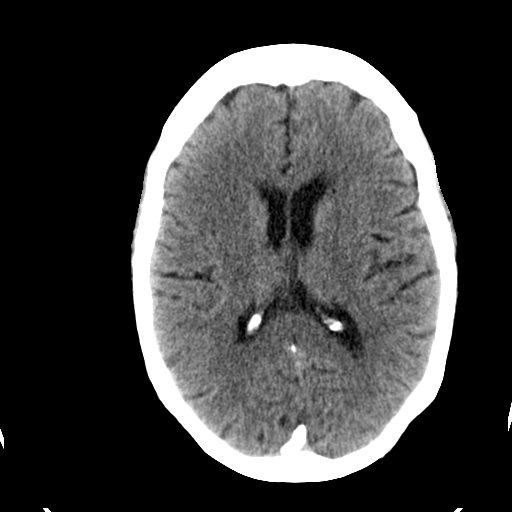
[im 17/32  brain]
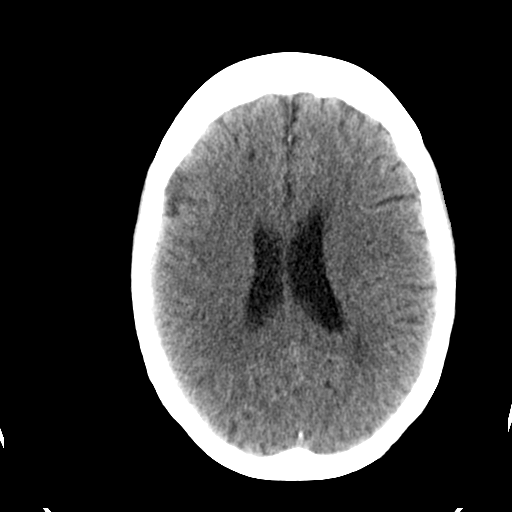
[im 17/32  bone]
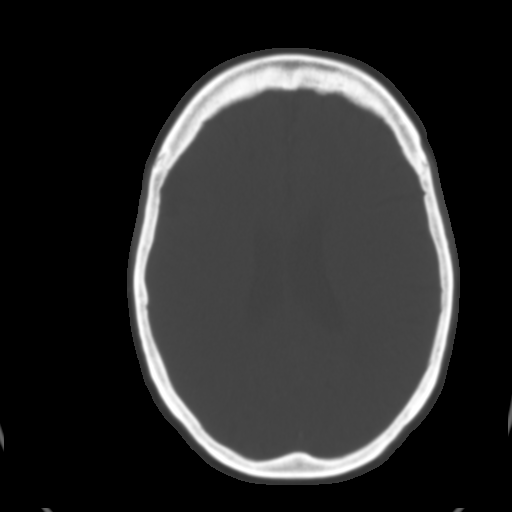
[im 19/32  brain]
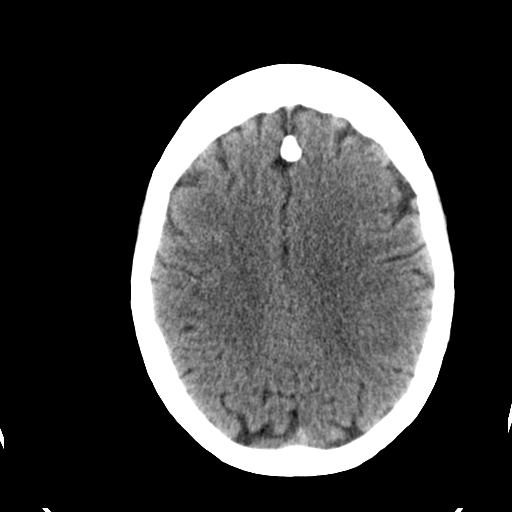
[im 21/32  brain]
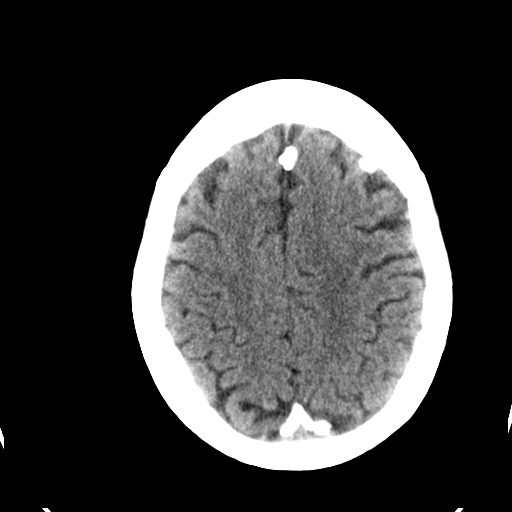
[im 23/32  brain]
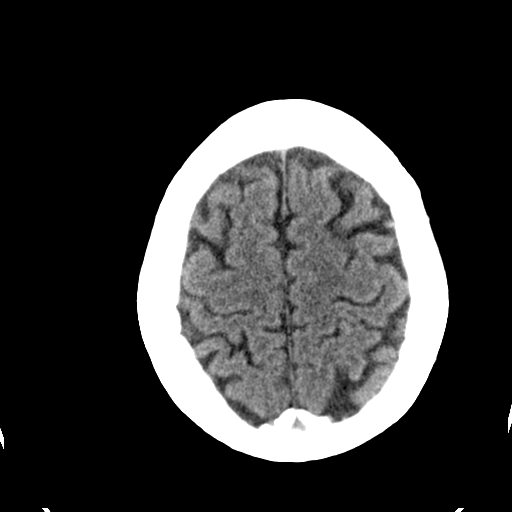
[im 24/32  brain]
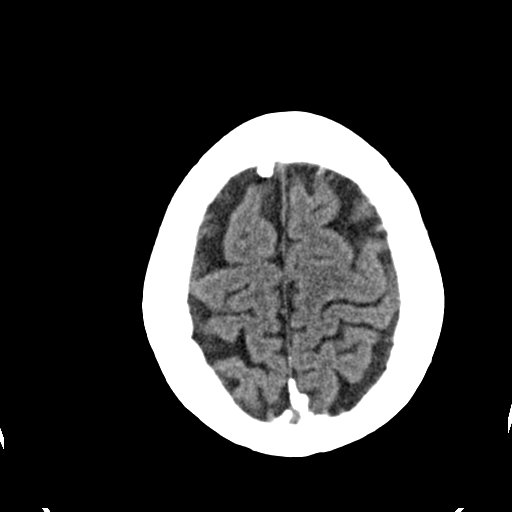
[im 24/32  bone]
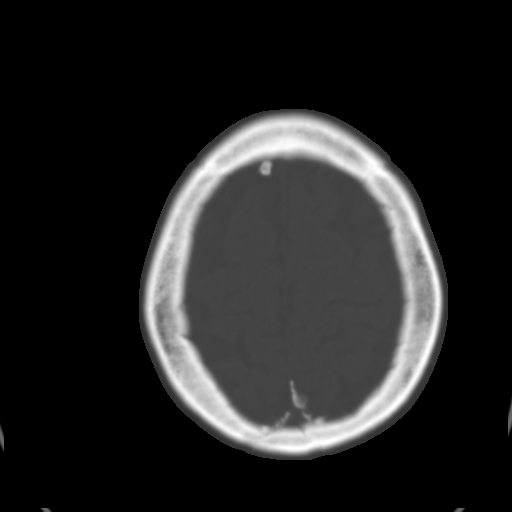
[im 26/32  brain]
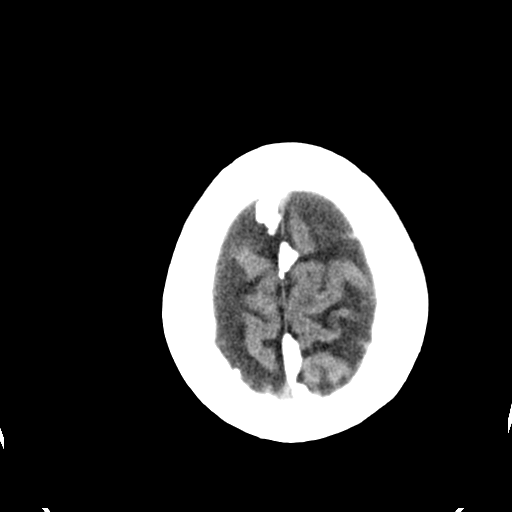
[im 28/32  brain]
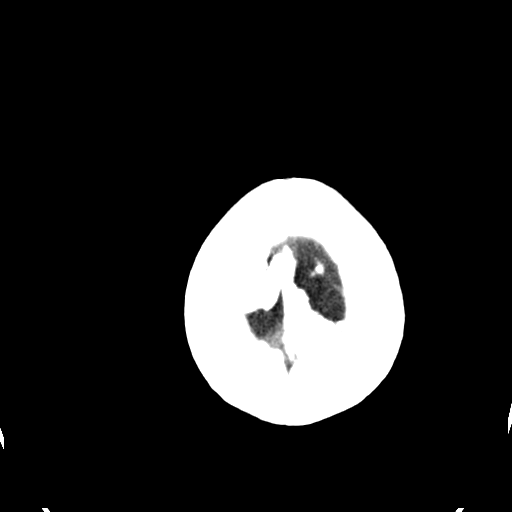
[im 30/32  brain]
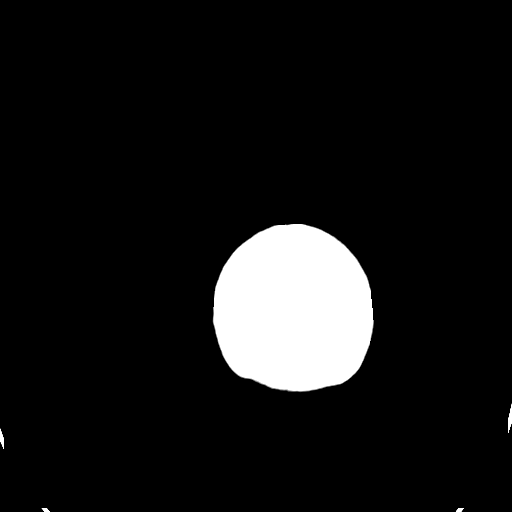

[16 of 30 positions shown; findings below may reference images not displayed]

FINDINGS: No acute hemorrhage, infarct, or mass lesion is identified.
Pansinusitis is identified with ethmoid and right maxillary mucous
retention cysts or polyps incidentally noted. No skull fracture.
Calcification/ossification along the interhemispheric falx and
extra-axial spaces could represent hyperostosis, sessile
meningioma(s), or sequela of remote hemorrhage. These findings are
unchanged since and compatible with benign findings.
IMPRESSION: No acute intracranial abnormality.

Sinusitis.

## 2016-01-28 IMAGING — MR MRI HEAD WITHOUT CONTRAST
10 series · 48 of 48 positions shown · non-contrast
Comparison: CT head 11/14/2014

CLINICAL DATA: Acute encephalopathy

EXAM:
MRI HEAD WITHOUT CONTRAST
TECHNIQUE: Multiplanar, multiecho pulse sequences of the brain and surrounding
structures were obtained without intravenous contrast.

[Series 2: GRE · sagittal · 5.0mm · 0.45mm/px · 2 of 23 slices shown (1 of 2)]
[im 1/23]
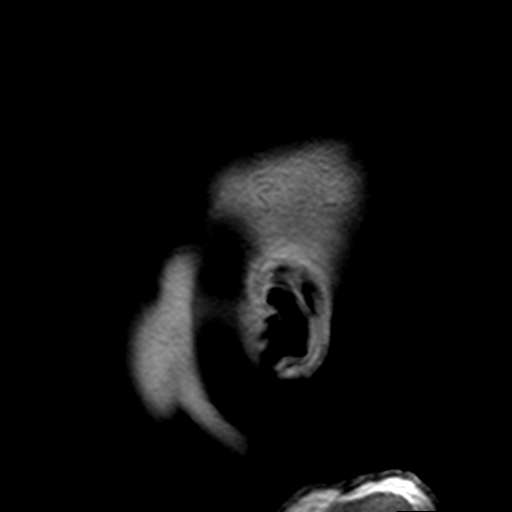
[im 23/23]
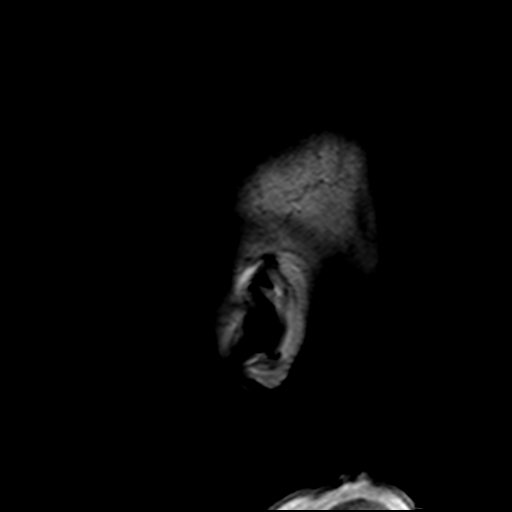

[Series 4: DWI · axial · 3.0mm · 1.80mm/px · z∈[-58,+106]mm · 6 of 43 slices shown (1 of 4)]
[im 1/43]
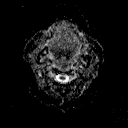
[im 9/43]
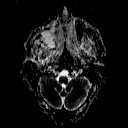
[im 17/43]
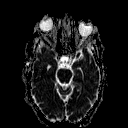
[im 26/43]
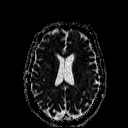
[im 34/43]
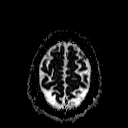
[im 43/43]
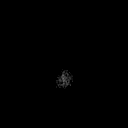

[Series 6: DWI · coronal · 3.0mm · 1.80mm/px · 8 of 62 slices shown (2 of 4)]
[im 1/62]
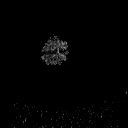
[im 9/62]
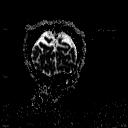
[im 18/62]
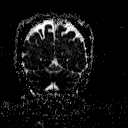
[im 27/62]
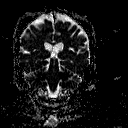
[im 35/62]
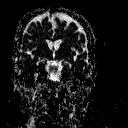
[im 44/62]
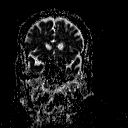
[im 53/62]
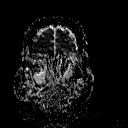
[im 62/62]
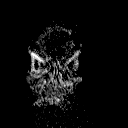

[Series 7: T2 · axial · 5.0mm · 0.45mm/px · z∈[-56,+113]mm · 4 of 27 slices shown (1 of 3)]
[im 1/27]
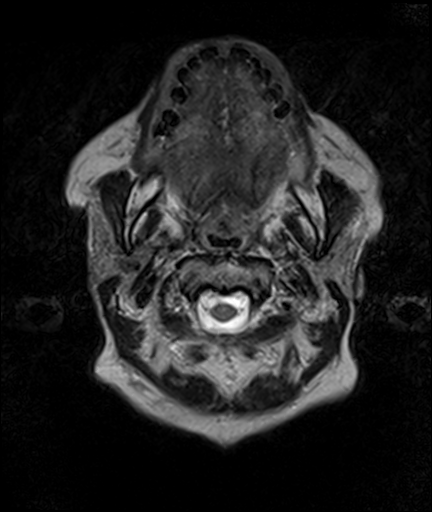
[im 9/27]
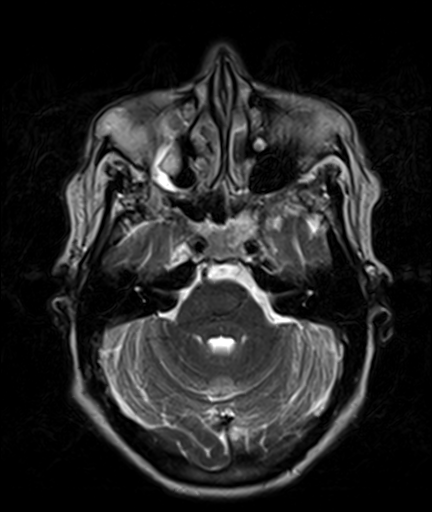
[im 18/27]
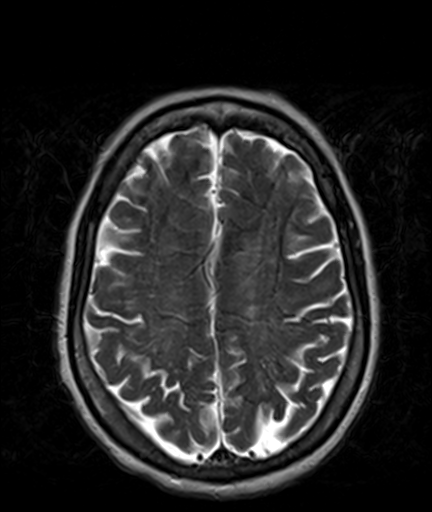
[im 27/27]
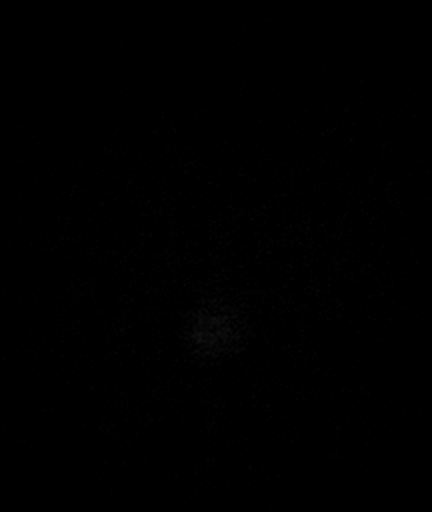

[Series 8: FLAIR · axial · 5.0mm · 0.45mm/px · z∈[-56,+113]mm · 4 of 27 slices shown]
[im 1/27]
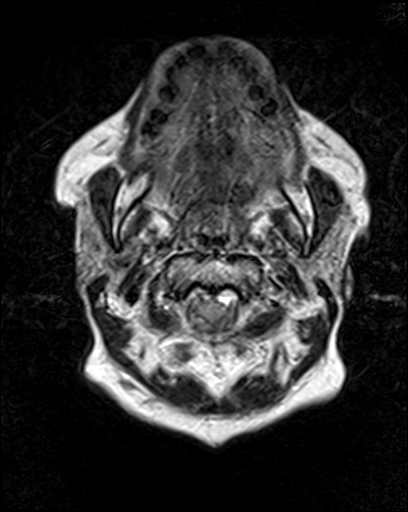
[im 9/27]
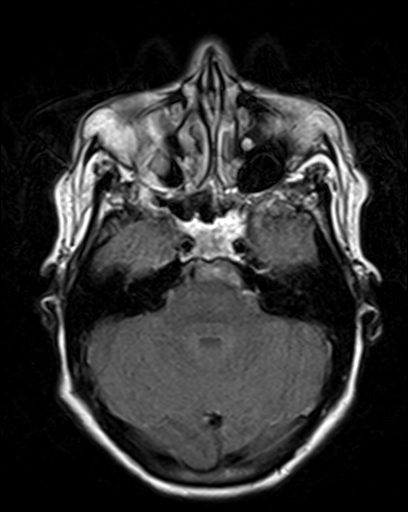
[im 18/27]
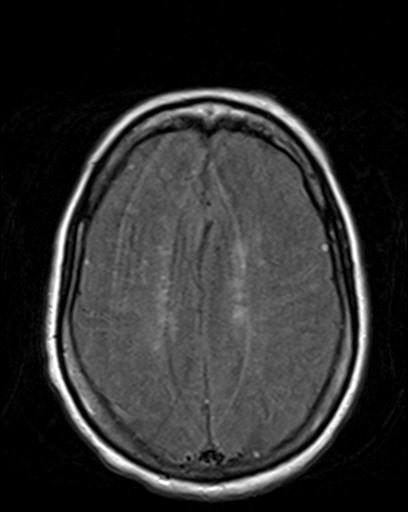
[im 27/27]
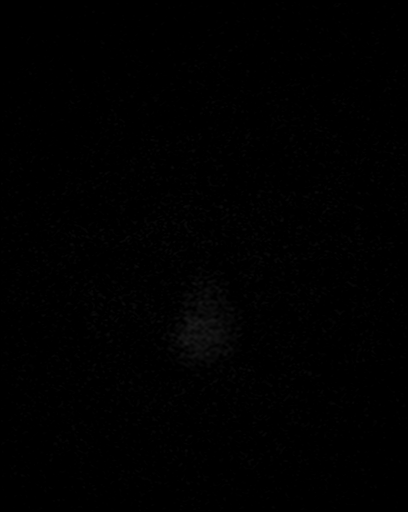

[Series 9: T2 · axial · 5.0mm · 1.20mm/px · z∈[-55,+114]mm · 3 of 26 slices shown (2 of 3)]
[im 1/26]
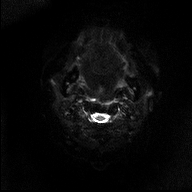
[im 13/26]
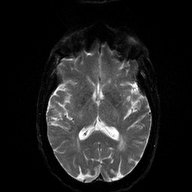
[im 26/26]
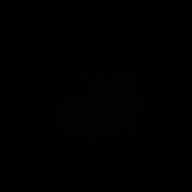

[Series 10: GRE · axial · 5.0mm · 0.45mm/px · z∈[-55,+114]mm · 4 of 27 slices shown (2 of 2)]
[im 1/27]
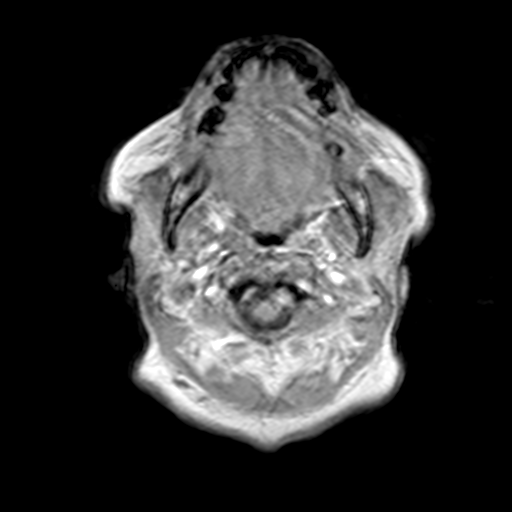
[im 9/27]
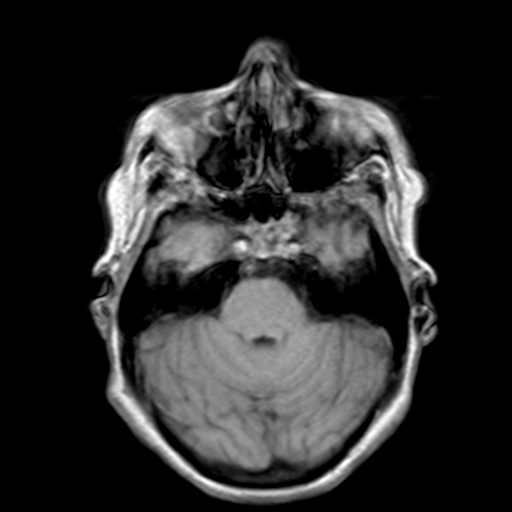
[im 18/27]
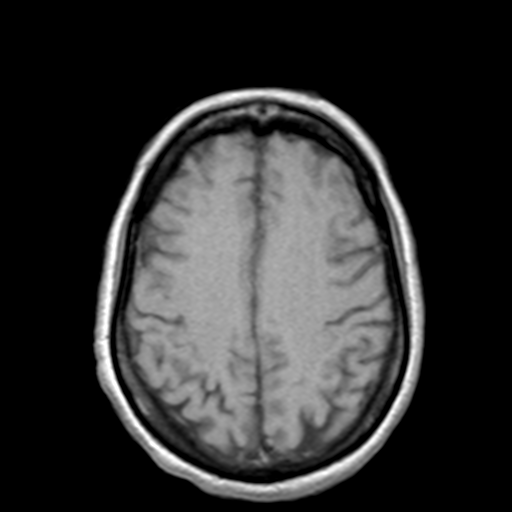
[im 27/27]
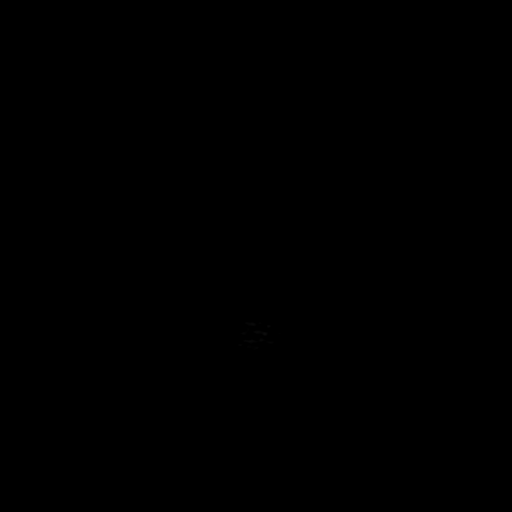

[Series 11: T2 · coronal · 5.0mm · 0.45mm/px · 4 of 30 slices shown (3 of 3)]
[im 1/30]
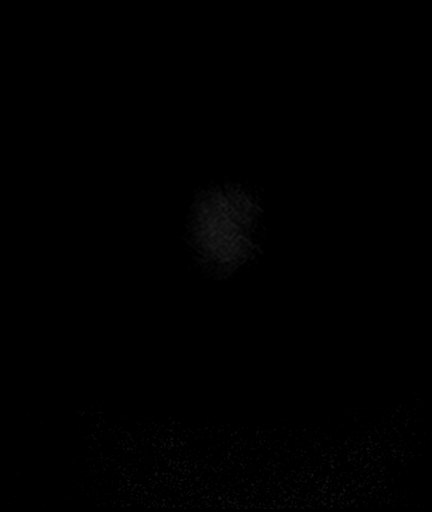
[im 10/30]
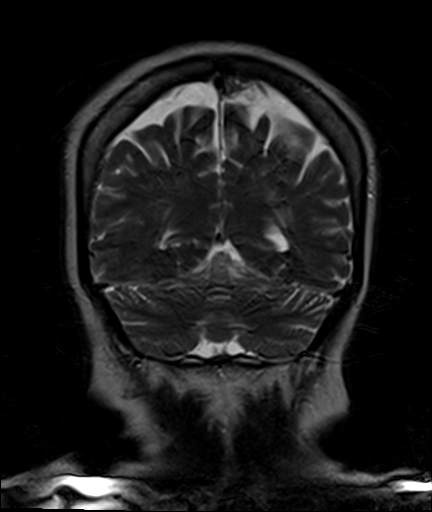
[im 20/30]
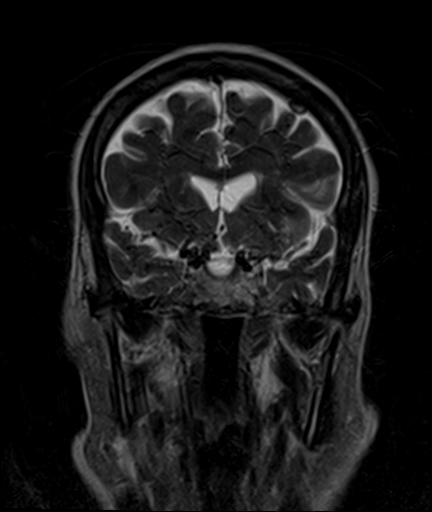
[im 30/30]
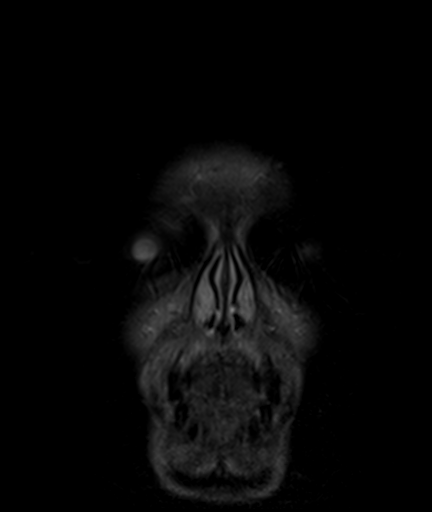

[Series 100: DWI · axial · 3.0mm · 1.80mm/px · z∈[-58,+106]mm · 5 of 42 slices shown (3 of 4)]
[im 1/42]
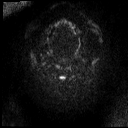
[im 11/42]
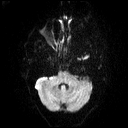
[im 21/42]
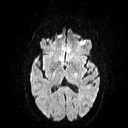
[im 31/42]
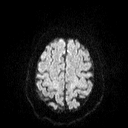
[im 42/42]
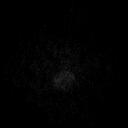

[Series 101: DWI · coronal · 3.0mm · 1.80mm/px · 8 of 62 slices shown (4 of 4)]
[im 1/62]
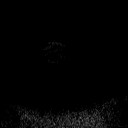
[im 9/62]
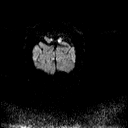
[im 18/62]
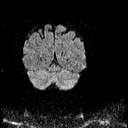
[im 27/62]
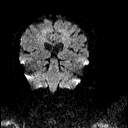
[im 35/62]
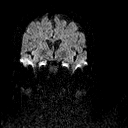
[im 44/62]
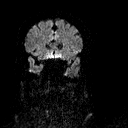
[im 53/62]
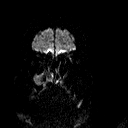
[im 62/62]
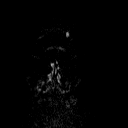

[48 of 48 positions shown; findings below may reference images not displayed]

FINDINGS: Ventricle size is normal.  Cerebral volume is normal.

Mild chronic microvascular ischemic change in the white matter.
Brainstem normal in signal

Small area of diffusion hyperintensity in the posterior occipital
cortex on axial and coronal diffusion. This is not definitely
confirmed as restricted diffusion on ADC map and could represent
artifact versus acute infarct.

No other area of acute infarct.

Negative for hemorrhage or mass lesion. No shift of the midline
structures

Mucosal edema in the paranasal sinuses with large retention cyst on
the right.
IMPRESSION: Possible small area of acute infarct right occipital lobe however I
would favor this is an artifact.

Mild chronic microvascular ischemic change. Negative for mass
lesion.

## 2016-08-23 IMAGING — CR DG CHEST 2V
1 series · 2 of 2 positions shown · non-contrast
Comparison: October 20, 2012.

CLINICAL DATA: Chest pain.

EXAM:
CHEST  2 VIEW

[Series 1: w chest pa · 0.14mm/px · 2 of 2 slices shown]
[im 1/2]
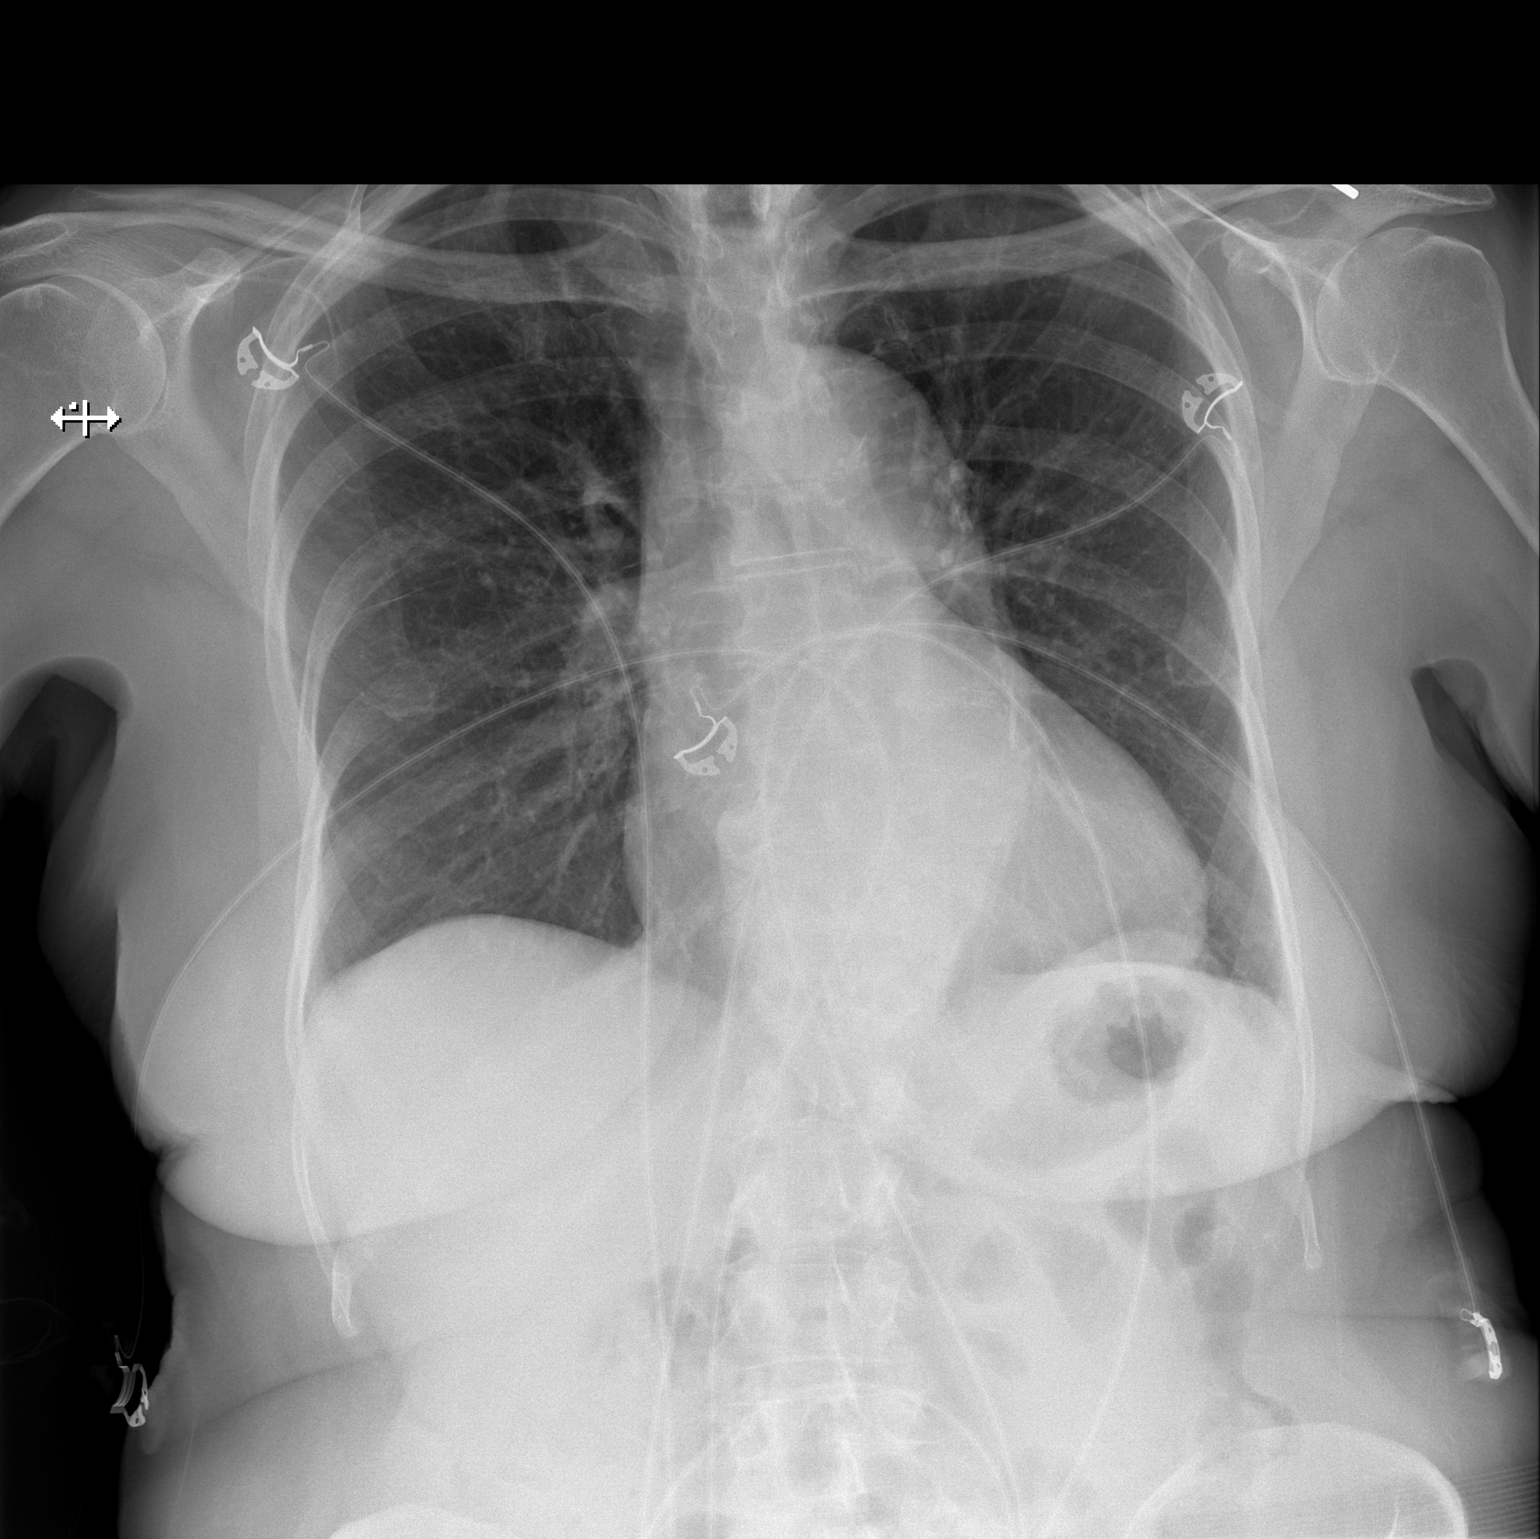
[im 2/2]
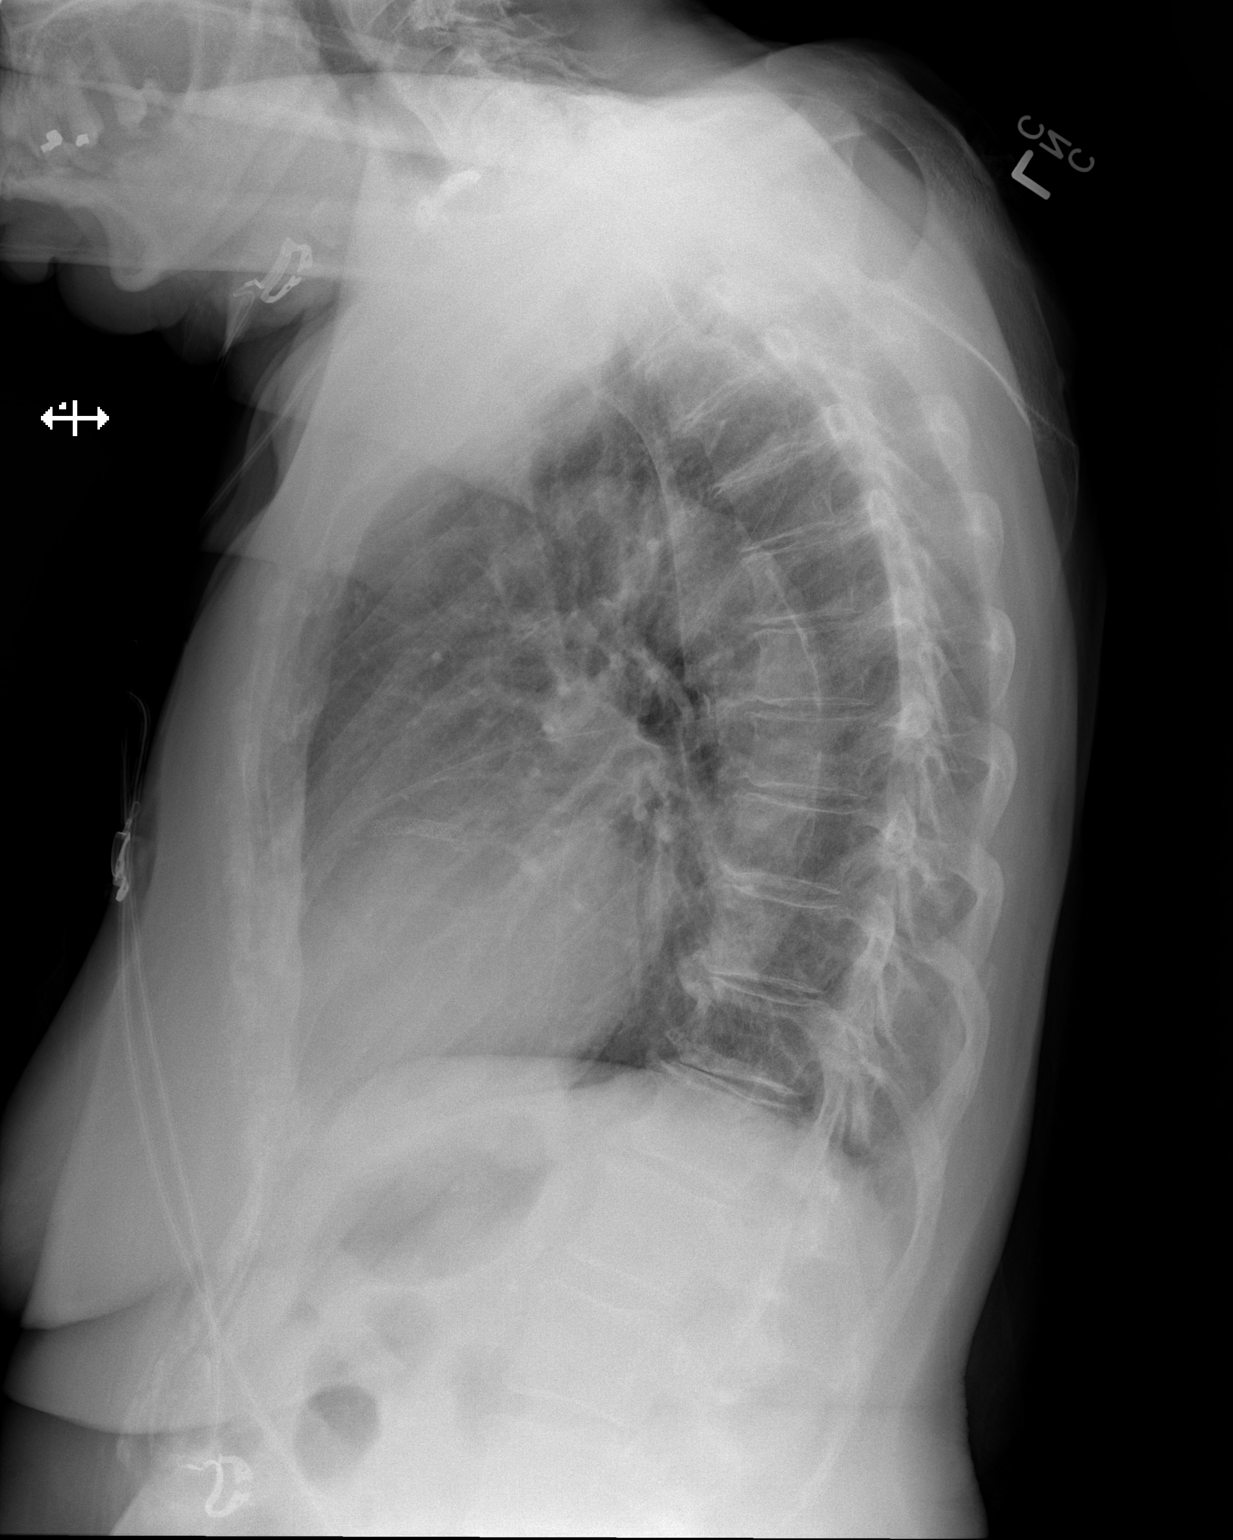

[2 of 2 positions shown; findings below may reference images not displayed]

FINDINGS: The heart size and mediastinal contours are within normal limits.
Both lungs are clear. No pneumothorax or pleural effusion is noted.
Multilevel degenerative disc disease is noted in the lower thoracic
spine.
IMPRESSION: No active cardiopulmonary disease.

## 2017-02-10 ENCOUNTER — Encounter: Payer: Self-pay | Admitting: Emergency Medicine

## 2017-02-10 ENCOUNTER — Emergency Department
Admission: EM | Admit: 2017-02-10 | Discharge: 2017-02-10 | Disposition: A | Discharge status: Home / Self Care | Payer: Medicare (Managed Care) | Attending: Emergency Medicine | Admitting: Emergency Medicine

## 2017-02-10 DIAGNOSIS — Z79899 Other long term (current) drug therapy: Secondary | ICD-10-CM | POA: Insufficient documentation

## 2017-02-10 DIAGNOSIS — R319 Hematuria, unspecified: Secondary | ICD-10-CM

## 2017-02-10 DIAGNOSIS — I10 Essential (primary) hypertension: Secondary | ICD-10-CM | POA: Diagnosis not present

## 2017-02-10 DIAGNOSIS — N390 Urinary tract infection, site not specified: Secondary | ICD-10-CM | POA: Insufficient documentation

## 2017-02-10 DIAGNOSIS — N179 Acute kidney failure, unspecified: Secondary | ICD-10-CM | POA: Insufficient documentation

## 2017-02-10 DIAGNOSIS — R1030 Lower abdominal pain, unspecified: Secondary | ICD-10-CM | POA: Diagnosis present

## 2017-02-10 DIAGNOSIS — I2510 Atherosclerotic heart disease of native coronary artery without angina pectoris: Secondary | ICD-10-CM | POA: Insufficient documentation

## 2017-02-10 LAB — CBC
HEMATOCRIT: 36.8 % (ref 35.0–47.0)
HEMOGLOBIN: 12.1 g/dL (ref 12.0–16.0)
MCH: 25.6 pg — AB (ref 26.0–34.0)
MCHC: 32.8 g/dL (ref 32.0–36.0)
MCV: 78 fL — ABNORMAL LOW (ref 80.0–100.0)
Platelets: 220 10*3/uL (ref 150–440)
RBC: 4.72 MIL/uL (ref 3.80–5.20)
RDW: 16.8 % — AB (ref 11.5–14.5)
WBC: 6.2 10*3/uL (ref 3.6–11.0)

## 2017-02-10 LAB — URINALYSIS, COMPLETE (UACMP) WITH MICROSCOPIC
Bilirubin Urine: NEGATIVE
Glucose, UA: NEGATIVE mg/dL
Hgb urine dipstick: NEGATIVE
Ketones, ur: NEGATIVE mg/dL
Nitrite: NEGATIVE
PROTEIN: NEGATIVE mg/dL
SPECIFIC GRAVITY, URINE: 1.016 (ref 1.005–1.030)
pH: 5 (ref 5.0–8.0)

## 2017-02-10 LAB — LIPASE, BLOOD: Lipase: 34 U/L (ref 11–51)

## 2017-02-10 LAB — COMPREHENSIVE METABOLIC PANEL
ALBUMIN: 4.1 g/dL (ref 3.5–5.0)
ALK PHOS: 85 U/L (ref 38–126)
ALT: 20 U/L (ref 14–54)
ANION GAP: 8 (ref 5–15)
AST: 25 U/L (ref 15–41)
BILIRUBIN TOTAL: 0.5 mg/dL (ref 0.3–1.2)
BUN: 30 mg/dL — AB (ref 6–20)
CALCIUM: 9.4 mg/dL (ref 8.9–10.3)
CO2: 22 mmol/L (ref 22–32)
Chloride: 107 mmol/L (ref 101–111)
Creatinine, Ser: 1.29 mg/dL — ABNORMAL HIGH (ref 0.44–1.00)
GFR calc Af Amer: 43 mL/min — ABNORMAL LOW (ref >60)
GFR calc non Af Amer: 37 mL/min — ABNORMAL LOW (ref >60)
GLUCOSE: 97 mg/dL (ref 65–99)
Potassium: 3.6 mmol/L (ref 3.5–5.1)
SODIUM: 137 mmol/L (ref 135–145)
Total Protein: 7.7 g/dL (ref 6.5–8.1)

## 2017-02-10 MED ORDER — SODIUM CHLORIDE 0.9 % IV BOLUS (SEPSIS)
1000.0000 mL | Freq: Once | INTRAVENOUS | Status: AC
  Administered 2017-02-10: 1000 mL via INTRAVENOUS

## 2017-02-10 MED ORDER — CEFTRIAXONE SODIUM IN DEXTROSE 20 MG/ML IV SOLN
1.0000 g | Freq: Once | INTRAVENOUS | Status: AC
  Administered 2017-02-10: 1 g via INTRAVENOUS
  Filled 2017-02-10: qty 50

## 2017-02-10 MED ORDER — CEPHALEXIN 500 MG PO CAPS: 500.0000 mg | ORAL_CAPSULE | Freq: Two times a day (BID) | ORAL | 0 refills | Status: DC

## 2017-02-10 NOTE — ED Notes (Signed)
Pt c/o of intermittent stomach pain for last 3 days. Normal BM but increase in occurrence. Family with patient. Pt reports pain in stomach worse when urinating.

## 2017-02-10 NOTE — ED Provider Notes (Signed)
[facility] Emergency Department Provider Note ____________________________________________   I have reviewed the triage vital signs and the triage nursing note.  HISTORY  Chief Complaint Abdominal Pain   Historian Patient and her daughter  HPI Isabel Molina is a 83 y.o. female presents with a complaint of lower abdominal pain associated with dysuria and frequency of urination. No nausea or vomiting. One episode of loose stool. No black or bloody stool reported. No fevers reported. No known sick contacts or ingestions. Family is not sure she's been diagnosed with diverticulitis in the past.  Patient were sounds moderate, currently essentially none. Pain is made worse when she urinates.    Past Medical History:  Diagnosis Date  . CAD (coronary artery disease)   . Chronic a-fib (HCC)   . Hypertension     There are no active problems to display for this patient.   Past Surgical History:  Procedure Laterality Date  . ABDOMINAL HYSTERECTOMY    . CHOLECYSTECTOMY    . CORONARY ANGIOPLASTY WITH STENT PLACEMENT  X 3  . NASAL SINUS SURGERY      Prior to Admission medications   Medication Sig Start Date End Date Taking? Authorizing Provider  amlodipine-atorvastatin (CADUET) 10-10 MG tablet Take 1 tablet by mouth daily.   Yes [provider]  aspirin EC 325 MG tablet Take 325 mg by mouth daily.    Yes [provider]  chlorthalidone (HYGROTON) 50 MG tablet Take 50 mg by mouth daily.    Yes [provider]  Melatonin 10 MG TABS Take 1 tablet by mouth at bedtime.   Yes [provider]  metoprolol succinate (TOPROL-XL) 50 MG 24 hr tablet Take 50 mg by mouth daily. Take with or immediately following a meal.   Yes [provider]  nitroGLYCERIN (NITROSTAT) 0.4 MG SL tablet Place 0.4 mg under the tongue every 5 (five) minutes as needed for chest pain.   Yes [provider]  olmesartan (BENICAR) 40 MG tablet Take  40 mg by mouth daily.   Yes [provider]  omeprazole (PRILOSEC) 20 MG capsule Take 20 mg by mouth at bedtime.   Yes [provider]  potassium chloride SA (K-DUR,KLOR-CON) 20 MEQ tablet Take 20 mEq by mouth at bedtime.   Yes [provider]  cephALEXin (KEFLEX) 500 MG capsule Take 1 capsule (500 mg total) by mouth 2 (two) times daily. 02/10/17   Governor Rooks, MD  cloNIDine (CATAPRES) 0.2 MG tablet Take 1 tablet (0.2 mg total) by mouth 2 (two) times daily as needed (BP approx 180/95). 06/13/15 06/12/16  Jennye Moccasin, MD    No Known Allergies  No family history on file.  Social History Social History  Substance Use Topics  . Smoking status: Never Smoker  . Smokeless tobacco: Not on file  . Alcohol use No    Review of Systems  Constitutional: Negative for fever. Eyes: Negative for visual changes. ENT: Negative for sore throat. Cardiovascular: Negative for chest pain. Respiratory: Negative for shortness of breath. Gastrointestinal: As per history of present illness. Genitourinary: Positive for dysuria. Musculoskeletal: Negative for back pain. Skin: Negative for rash. Neurological: Negative for headache.  ____________________________________________   PHYSICAL EXAM:  VITAL SIGNS: ED Triage Vitals  Enc Vitals Group     BP 02/10/17 0949 129/84     Pulse Rate 02/10/17 0949 85     Resp 02/10/17 0949 17     Temp 02/10/17 0949 97.8 F (36.6 C)  Temp Source 02/10/17 0949 Oral     SpO2 --      Weight 02/10/17 0950 145 lb (65.8 kg)     Height 02/10/17 0950 5\' 4"  (1.626 m)     Head Circumference --      Peak Flow --      Pain Score 02/10/17 0949 0     Pain Loc --      Pain Edu? --      Excl. in GC? --      Constitutional: Alert and cooperative. Well appearing and in no distress. HEENT   Head: Normocephalic and atraumatic.      Eyes: Conjunctivae are normal. Pupils equal and round.       Ears:         Nose: No  congestion/rhinnorhea.   Mouth/Throat: Mucous membranes are moist.   Neck: No stridor. Cardiovascular/Chest: Normal rate, regular rhythm.  No murmurs, rubs, or gallops. Respiratory: Normal respiratory effort without tachypnea nor retractions. Breath sounds are clear and equal bilaterally. No wheezes/rales/rhonchi. Gastrointestinal: Soft. No distention, no guarding, no rebound. Very mild discomfort without any McBurney's point tenderness.  Genitourinary/rectal:Deferred Musculoskeletal: Nontender with normal range of motion in all extremities. No joint effusions.  No lower extremity tenderness.  No edema. Neurologic:  Normal speech and language. No gross or focal neurologic deficits are appreciated. Skin:  Skin is warm, dry and intact. No rash noted. Psychiatric: Mood and affect are normal. Speech and behavior are normal. Patient exhibits appropriate insight and judgment.   ____________________________________________  LABS (pertinent positives/negatives)  Labs Reviewed  COMPREHENSIVE METABOLIC PANEL - Abnormal; Notable for the following:       Result Value   BUN 30 (*)    Creatinine, Ser 1.29 (*)    GFR calc non Af Amer 37 (*)    GFR calc Af Amer 43 (*)    All other components within normal limits  CBC - Abnormal; Notable for the following:    MCV 78.0 (*)    MCH 25.6 (*)    RDW 16.8 (*)    All other components within normal limits  URINALYSIS, COMPLETE (UACMP) WITH MICROSCOPIC - Abnormal; Notable for the following:    Color, Urine YELLOW (*)    APPearance CLOUDY (*)    Leukocytes, UA SMALL (*)    Bacteria, UA FEW (*)    Squamous Epithelial / LPF 6-30 (*)    All other components within normal limits  URINE CULTURE  LIPASE, BLOOD    ____________________________________________    EKG I, Governor Rooksebecca Oakley Kossman, MD, the attending physician have personally viewed and interpreted all ECGs.  None ____________________________________________  RADIOLOGY All Xrays were viewed by  me. Imaging interpreted by Radiologist.  None __________________________________________  PROCEDURES  Procedure(s) performed: None  Critical Care performed: None  ____________________________________________   ED COURSE / ASSESSMENT AND PLAN  Pertinent labs & imaging results that were available during my care of the patient were reviewed by me and considered in my medical decision making (see chart for details).   Ms. Isabel Molina's symptoms certainly sound like possible urinary tract infection. After studies are back and reassuring. No elevated white blood cell count. No fever. Currently she is not having any symptoms at rest.  BUN and creatinine are slightly elevated with comparison to prior. I discussed with patient and family giving a liter fluid for likely dehydration. Daughter states patient has been diagnosed with chronic kidney disease in the past.   Urinalysis is probably consistent with urinary tract infection, and I'm  sending a culture but also treating with one dose of Rocephin and then discharged with Keflex.    CONSULTATIONS: None  Patient / Family / Caregiver informed of clinical course, medical decision-making process, and agree with plan.   I discussed return precautions, follow-up instructions, and discharge instructions with patient and/or family.  Discharge Instructions : You were evaluated for pain and burning with urination, and are being treated for urinary tract infection. Your kidney function also shows slight decreased with comparison to prior, and I suspect a component of dehydration. You were given IV fluids in the emergency department as well as 1 dose of Rocephin antibiotic for urinary tract infection which will cover for 24 hours. Start your antibiotic pills Keflex, starting tomorrow.  Return to the emergency department immediately for any worsening abdominal pain, vomiting and cannot keep medications down, concern for dehydration such as not making  urine are dry mouth, fever, confusion or altered mental status, or any other symptoms concerning to you.  ___________________________________________   FINAL CLINICAL IMPRESSION(S) / ED DIAGNOSES   Final diagnoses:  Acute renal failure, unspecified acute renal failure type (HCC)  Urinary tract infection with hematuria, site unspecified              Note: This dictation was prepared with Dragon dictation. Any transcriptional errors that result from this process are unintentional    Governor Rooks, MD 02/10/17 1147

## 2017-02-10 NOTE — Discharge Instructions (Signed)
You were evaluated for pain and burning with urination, and are being treated for urinary tract infection. Your kidney function also shows slight decreased with comparison to prior, and I suspect a component of dehydration. You were given IV fluids in the emergency department as well as 1 dose of Rocephin antibiotic for urinary tract infection which will cover for 24 hours. Start your antibiotic pills Keflex, starting tomorrow.  Return to the emergency department immediately for any worsening abdominal pain, vomiting and cannot keep medications down, concern for dehydration such as not making urine are dry mouth, fever, confusion or altered mental status, or any other symptoms concerning to you.

## 2017-02-10 NOTE — ED Notes (Signed)
Pt assisted to bathroom. Pt had large, soft, brown BM.

## 2017-02-10 NOTE — ED Notes (Signed)
Pt ambulatory to toilet, family member at bedside. Pt will be wheeled to lobby once she is dressed. Understanding of DC papers, no further questions.

## 2017-02-10 NOTE — ED Triage Notes (Signed)
Pt reports lower abdominal pain x5 days. Pt's family member reports pt was doubled over yesterday and this morning with pain. Pt denies pain at present, pt does have dementia.

## 2017-02-10 NOTE — ED Notes (Addendum)
Pt states pain with urination in lower belly and lower back. States frequent urination. Pt states loose stools sine this AM. Pain x few days. Alert, oriented, answering questions correctly. Visitor with pt. No distress noted at this time. Dr. Shaune PollackLord at bedside.

## 2017-02-11 LAB — URINE CULTURE: Culture: <10000 — AB

## 2018-03-23 ENCOUNTER — Emergency Department: Payer: Medicare (Managed Care)

## 2018-03-23 ENCOUNTER — Encounter: Payer: Self-pay | Admitting: Emergency Medicine

## 2018-03-23 ENCOUNTER — Emergency Department
Admission: EM | Admit: 2018-03-23 | Discharge: 2018-03-23 | Disposition: A | Discharge status: Home / Self Care | Payer: Medicare (Managed Care) | Attending: Emergency Medicine | Admitting: Emergency Medicine

## 2018-03-23 DIAGNOSIS — Z79899 Other long term (current) drug therapy: Secondary | ICD-10-CM | POA: Insufficient documentation

## 2018-03-23 DIAGNOSIS — I10 Essential (primary) hypertension: Secondary | ICD-10-CM | POA: Diagnosis not present

## 2018-03-23 DIAGNOSIS — R4182 Altered mental status, unspecified: Secondary | ICD-10-CM | POA: Diagnosis present

## 2018-03-23 DIAGNOSIS — E860 Dehydration: Secondary | ICD-10-CM | POA: Insufficient documentation

## 2018-03-23 DIAGNOSIS — J189 Pneumonia, unspecified organism: Secondary | ICD-10-CM | POA: Diagnosis not present

## 2018-03-23 DIAGNOSIS — J181 Lobar pneumonia, unspecified organism: Secondary | ICD-10-CM

## 2018-03-23 LAB — CBC WITH DIFFERENTIAL/PLATELET
BASOS PCT: 0 %
Basophils Absolute: 0 10*3/uL (ref 0–0.1)
EOS ABS: 0 10*3/uL (ref 0–0.7)
Eosinophils Relative: 0 %
HCT: 39.5 % (ref 35.0–47.0)
HEMOGLOBIN: 12.9 g/dL (ref 12.0–16.0)
Lymphocytes Relative: 10 %
Lymphs Abs: 0.5 10*3/uL — ABNORMAL LOW (ref 1.0–3.6)
MCH: 26.2 pg (ref 26.0–34.0)
MCHC: 32.5 g/dL (ref 32.0–36.0)
MCV: 80.6 fL (ref 80.0–100.0)
Monocytes Absolute: 0.1 10*3/uL — ABNORMAL LOW (ref 0.2–0.9)
Monocytes Relative: 2 %
NEUTROS PCT: 88 %
Neutro Abs: 3.8 10*3/uL (ref 1.4–6.5)
PLATELETS: 119 10*3/uL — AB (ref 150–440)
RBC: 4.9 MIL/uL (ref 3.80–5.20)
RDW: 18.3 % — ABNORMAL HIGH (ref 11.5–14.5)
WBC: 4.4 10*3/uL (ref 3.6–11.0)

## 2018-03-23 LAB — COMPREHENSIVE METABOLIC PANEL
ALBUMIN: 3.6 g/dL (ref 3.5–5.0)
ALT: 11 U/L (ref 0–44)
AST: 16 U/L (ref 15–41)
Alkaline Phosphatase: 63 U/L (ref 38–126)
Anion gap: 15 (ref 5–15)
BUN: 30 mg/dL — ABNORMAL HIGH (ref 8–23)
CHLORIDE: 112 mmol/L — AB (ref 98–111)
CO2: 18 mmol/L — AB (ref 22–32)
CREATININE: 1.42 mg/dL — AB (ref 0.44–1.00)
Calcium: 9.5 mg/dL (ref 8.9–10.3)
GFR calc non Af Amer: 33 mL/min — ABNORMAL LOW (ref >60)
GFR, EST AFRICAN AMERICAN: 38 mL/min — AB (ref >60)
Glucose, Bld: 116 mg/dL — ABNORMAL HIGH (ref 70–99)
Potassium: 3.9 mmol/L (ref 3.5–5.1)
SODIUM: 145 mmol/L (ref 135–145)
Total Bilirubin: 1.1 mg/dL (ref 0.3–1.2)
Total Protein: 6.8 g/dL (ref 6.5–8.1)

## 2018-03-23 LAB — TROPONIN I: Troponin I: 0.03 ng/mL (ref <0.03)

## 2018-03-23 MED ORDER — AZITHROMYCIN 500 MG IV SOLR
500.0000 mg | Freq: Once | INTRAVENOUS | Status: AC
  Administered 2018-03-23: 500 mg via INTRAVENOUS
  Filled 2018-03-23: qty 500

## 2018-03-23 MED ORDER — METOPROLOL TARTRATE 5 MG/5ML IV SOLN
5.0000 mg | Freq: Once | INTRAVENOUS | Status: AC
  Administered 2018-03-23: 5 mg via INTRAVENOUS
  Filled 2018-03-23: qty 5

## 2018-03-23 MED ORDER — SODIUM CHLORIDE 0.9 % IV SOLN
1000.0000 mL | Freq: Once | INTRAVENOUS | Status: AC
  Administered 2018-03-23: 1000 mL via INTRAVENOUS

## 2018-03-23 MED ORDER — SODIUM CHLORIDE 0.9 % IV SOLN
1.0000 g | Freq: Once | INTRAVENOUS | Status: AC
  Administered 2018-03-23: 1 g via INTRAVENOUS
  Filled 2018-03-23: qty 10

## 2018-03-23 NOTE — ED Provider Notes (Signed)
[facility] Emergency Department Provider Note   ____________________________________________    I have reviewed the triage vital signs and the nursing notes.   HISTORY  Chief Complaint Altered Mental Status     HPI Isabel Molina is a 84 y.o. female who presents with altered mental status.  Family reports the patient has been sleeping the majority of the day over the last several days.  She does have a history of dementia.  [facility].  No reports of fever.  Today they had difficulty arousing her apparently and there was some concern about her breathing.  However now she seems to be more at her baseline   Past Medical History:  Diagnosis Date  . CAD (coronary artery disease)   . Chronic a-fib (HCC)   . Hypertension     There are no active problems to display for this patient.   Past Surgical History:  Procedure Laterality Date  . ABDOMINAL HYSTERECTOMY    . CHOLECYSTECTOMY    . CORONARY ANGIOPLASTY WITH STENT PLACEMENT  X 3  . NASAL SINUS SURGERY      Prior to Admission medications   Medication Sig Start Date End Date Taking? Authorizing Provider  meloxicam (MOBIC) 7.5 MG tablet Take 7.5 mg by mouth daily.   Yes [provider]  memantine (NAMENDA) 10 MG tablet Take 10 mg by mouth 2 (two) times daily.   Yes [provider]  metoprolol tartrate (LOPRESSOR) 25 MG tablet Take 25 mg by mouth 2 (two) times daily.   Yes [provider]  olmesartan (BENICAR) 40 MG tablet Take 40 mg by mouth daily.   Yes [provider]  Psyllium 28.3 % PACK Take 1 packet by mouth daily.   Yes [provider]  QUEtiapine (SEROQUEL) 25 MG tablet Take 12.5 mg by mouth daily as needed (agitation).   Yes [provider]  ranitidine (ZANTAC) 150 MG tablet Take 150 mg by mouth at bedtime.   Yes [provider]  zinc oxide 20 % ointment Apply 1 application topically 3 (three) times  daily as needed for irritation.   Yes [provider]     Allergies Patient has no known allergies.  No family history on file.  Social History Social History   Tobacco Use  . Smoking status: Never Smoker  Substance Use Topics  . Alcohol use: No  . Drug use: No    Review of Systems  Constitutional: Decreased responsiveness Eyes: No visual changes.  ENT: No sore throat. Cardiovascular: Denies chest pain. Respiratory: As above Gastrointestinal: No abdominal pain.  No nausea, no vomiting.  Genitourinary: Negative for dysuria. Musculoskeletal: Negative for back pain. Skin: Negative for rash. Neurological: Negative for headaches   ____________________________________________   PHYSICAL EXAM:  VITAL SIGNS: ED Triage Vitals  Enc Vitals Group     BP 03/23/18 1033 (!) 144/86     Pulse Rate 03/23/18 1030 (!) 47     Resp 03/23/18 1100 14     Temp 03/23/18 1030 97.8 F (36.6 C)     Temp Source 03/23/18 1030 Axillary     SpO2 03/23/18 1030 100 %     Weight 03/23/18 1030 59.9 kg (132 lb)     Height 03/23/18 1030 1.575 m (5\' 2" )     Head Circumference --      Peak Flow --      Pain Score --      Pain Loc --  Pain Edu? --      Excl. in GC? --     Constitutional: Alert No acute distress. Eyes: Conjunctivae are normal.   Nose: No congestion/rhinnorhea. Mouth/Throat: Mucous membranes are dry  Cardiovascular: Tachycardia, irregularly irregular rhythm. Grossly normal heart sounds.  Good peripheral circulation. Respiratory: Normal respiratory effort.  No retractions.  Bibasilar Rales Gastrointestinal: Soft and nontender. No distention.    Musculoskeletal: No lower extremity tenderness nor edema.  Warm and well perfused Neuro: No gross focal neurologic deficits are appreciated.  Skin:  Skin is warm, dry and intact. No rash noted.   ____________________________________________   LABS (all labs ordered are listed, but only abnormal results are  displayed)  Labs Reviewed  CBC WITH DIFFERENTIAL/PLATELET - Abnormal; Notable for the following components:      Result Value   RDW 18.3 (*)    Platelets 119 (*)    Lymphs Abs 0.5 (*)    Monocytes Absolute 0.1 (*)    All other components within normal limits  COMPREHENSIVE METABOLIC PANEL - Abnormal; Notable for the following components:   Chloride 112 (*)    CO2 18 (*)    Glucose, Bld 116 (*)    BUN 30 (*)    Creatinine, Ser 1.42 (*)    GFR calc non Af Amer 33 (*)    GFR calc Af Amer 38 (*)    All other components within normal limits  TROPONIN I - Abnormal; Notable for the following components:   Troponin I 0.03 (*)    All other components within normal limits  URINALYSIS, COMPLETE (UACMP) WITH MICROSCOPIC   ____________________________________________  EKG  ED ECG REPORT I, Jene Every, the attending physician, personally viewed and interpreted this ECG.  Date: 03/23/2018  Rhythm: Atrial fibrillation with rapid ventricular response QRS Axis: normal Intervals: Abnormal ST/T Wave abnormalities: Nonspecific changes   ____________________________________________  RADIOLOGY  Chest x-ray demonstrated right lung pneumonia ____________________________________________   PROCEDURES  Procedure(s) performed: No  Procedures   Critical Care performed: No ____________________________________________   INITIAL IMPRESSION / ASSESSMENT AND PLAN / ED COURSE  Pertinent labs & imaging results that were available during my care of the patient were reviewed by me and considered in my medical decision making (see chart for details).  Patient presents with what appears to be a somewhat gradual decline, with severe dementia.  Patient's PCP Dr. Victory Dakin is here in the emergency department and is discussing treatment plans with family.  IV metoprolol given for AFib with RVR, patient has not had PO metoprolol in 2 days  We will do IV fluids for dehydration in the emergency  department, give a dose of IV antibiotics given likely pneumonia on chest x-ray and [facility] the [facility] to [facility] where she will continue to r receive antibodies    ____________________________________________   FINAL CLINICAL IMPRESSION(S) / ED DIAGNOSES  Final diagnoses:  Community acquired pneumonia of right lower lobe of lung (HCC)  Dehydration        Note:  This document was prepared using Dragon voice recognition software and may include unintentional dictation errors.    Jene Every, MD 03/23/18 1440

## 2018-03-23 NOTE — ED Triage Notes (Addendum)
Pt arrived via ems with concerns over difficulty arousing patient. Pt is alert and fully awake during triage, family at bedside reports pt is still not at baseline. Family reports pt missed a days worth of medication including metoprolol. Pt tachycardic in triage. Pt has dementia at baseline. Family reports pt "hasnt been normal for days."

## 2018-03-23 NOTE — ED Notes (Signed)
Nurse assisted NT with cleaning and repositioning pt. Pt is aggressive when moved but seems to be easily redirected. Pt in NAD. Nurse has d/c'd PIV and removed pt from monitor at this time. Water given to pt's family. [facility] be arriving [facility] the pt momentarily.

## 2018-03-23 NOTE — ED Notes (Signed)
Pt in NAD and placed in w/c for transport home by [facility]

## 2018-04-16 DEATH — deceased

## 2019-06-03 IMAGING — DX DG CHEST 1V PORT
1 series · 1 of 1 positions shown · non-contrast
Comparison: Chest x-ray of June 13, 2015

CLINICAL DATA: Difficult to arouse the patient today. Found to be
tachycardia. Has dementia. History of coronary artery disease with
stent placement.

EXAM:
PORTABLE CHEST 1 VIEW

[chest ap]
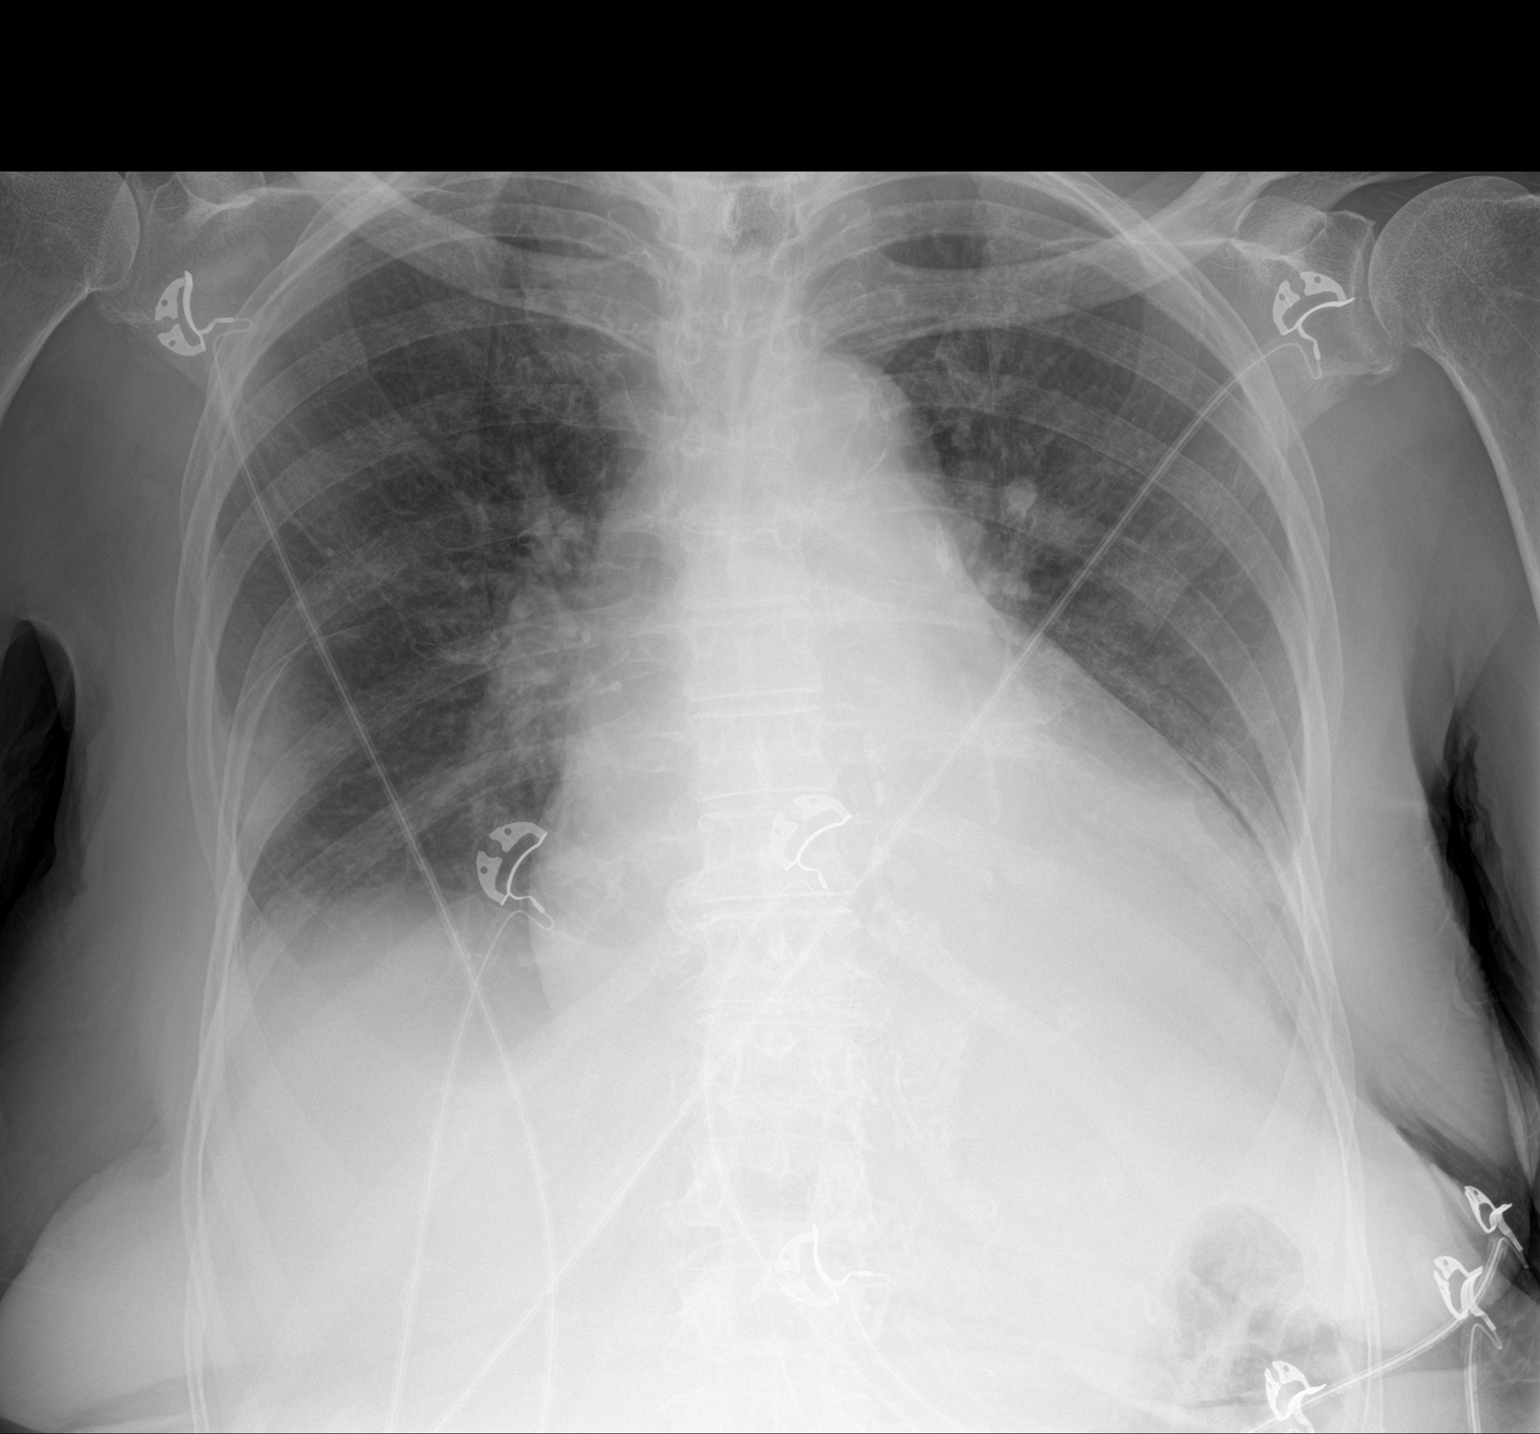

[1 of 1 positions shown; findings below may reference images not displayed]

FINDINGS: The lungs are adequately inflated. The left hemidiaphragm is
obscured and the right hemidiaphragm is ill-defined. The cardiac
silhouette is enlarged. The pulmonary vascularity is mildly
prominent centrally. There is airspace opacity peripherally in the
right mid lung which suggests pneumonia. There is calcification in
the wall of the thoracic aorta. There are degenerative disc changes
of the lower thoracic spine.
IMPRESSION: Focal pneumonia peripherally in the mid to lower portion of the
right lung. Possible small bilateral pleural effusions. Slight
interval increase in the size of the cardiac silhouette allowing for
the portable technique. No overt pulmonary edema. Followup PA and
lateral chest X-ray is recommended in 3-4 weeks following trial of
antibiotic therapy to ensure resolution and exclude underlying
malignancy.

Thoracic aortic atherosclerosis.
